# Patient Record
Sex: Female | Born: 1965 | Race: White | Hispanic: No | Marital: Single | Smoking: Never smoker
Health system: Southern US, Community
[De-identification: ages and names within clinical notes are randomized; demographics above are authoritative.]

## PROBLEM LIST (undated history)

## (undated) DIAGNOSIS — E119 Type 2 diabetes mellitus without complications: Secondary | ICD-10-CM

## (undated) DIAGNOSIS — N921 Excessive and frequent menstruation with irregular cycle: Secondary | ICD-10-CM

## (undated) DIAGNOSIS — N189 Chronic kidney disease, unspecified: Secondary | ICD-10-CM

## (undated) DIAGNOSIS — R87629 Unspecified abnormal cytological findings in specimens from vagina: Secondary | ICD-10-CM

## (undated) DIAGNOSIS — D649 Anemia, unspecified: Secondary | ICD-10-CM

## (undated) DIAGNOSIS — D219 Benign neoplasm of connective and other soft tissue, unspecified: Secondary | ICD-10-CM

## (undated) DIAGNOSIS — N939 Abnormal uterine and vaginal bleeding, unspecified: Secondary | ICD-10-CM

## (undated) DIAGNOSIS — E282 Polycystic ovarian syndrome: Secondary | ICD-10-CM

## (undated) DIAGNOSIS — E785 Hyperlipidemia, unspecified: Secondary | ICD-10-CM

## (undated) DIAGNOSIS — R51 Headache: Secondary | ICD-10-CM

## (undated) DIAGNOSIS — Z8719 Personal history of other diseases of the digestive system: Secondary | ICD-10-CM

## (undated) DIAGNOSIS — R519 Headache, unspecified: Secondary | ICD-10-CM

## (undated) DIAGNOSIS — K219 Gastro-esophageal reflux disease without esophagitis: Secondary | ICD-10-CM

## (undated) DIAGNOSIS — E669 Obesity, unspecified: Secondary | ICD-10-CM

## (undated) DIAGNOSIS — Z9889 Other specified postprocedural states: Secondary | ICD-10-CM

## (undated) DIAGNOSIS — I10 Essential (primary) hypertension: Secondary | ICD-10-CM

## (undated) DIAGNOSIS — G4730 Sleep apnea, unspecified: Secondary | ICD-10-CM

## (undated) HISTORY — DX: Hyperlipidemia, unspecified: E78.5

## (undated) HISTORY — DX: Other specified postprocedural states: Z98.890

## (undated) HISTORY — PX: COLONOSCOPY: SHX174

## (undated) HISTORY — DX: Essential (primary) hypertension: I10

## (undated) HISTORY — DX: Type 2 diabetes mellitus without complications: E11.9

## (undated) HISTORY — DX: Unspecified abnormal cytological findings in specimens from vagina: R87.629

## (undated) HISTORY — DX: Excessive and frequent menstruation with irregular cycle: N92.1

## (undated) HISTORY — DX: Abnormal uterine and vaginal bleeding, unspecified: N93.9

## (undated) HISTORY — DX: Obesity, unspecified: E66.9

## (undated) HISTORY — DX: Benign neoplasm of connective and other soft tissue, unspecified: D21.9

## (undated) HISTORY — PX: NOVASURE ABLATION: SHX5394

## (undated) HISTORY — PX: DILATION AND CURETTAGE OF UTERUS: SHX78

## (undated) HISTORY — PX: TONSILLECTOMY: SUR1361

## (undated) HISTORY — DX: Polycystic ovarian syndrome: E28.2

---

## 2000-07-18 HISTORY — PX: DILATION AND CURETTAGE OF UTERUS: SHX78

## 2006-02-03 ENCOUNTER — Ambulatory Visit: Payer: Self-pay | Admitting: Unknown Physician Specialty

## 2006-03-01 ENCOUNTER — Ambulatory Visit: Payer: Self-pay | Admitting: Urology

## 2007-07-19 DIAGNOSIS — R87629 Unspecified abnormal cytological findings in specimens from vagina: Secondary | ICD-10-CM

## 2007-07-19 HISTORY — DX: Unspecified abnormal cytological findings in specimens from vagina: R87.629

## 2008-10-24 ENCOUNTER — Ambulatory Visit: Payer: Self-pay | Admitting: Unknown Physician Specialty

## 2008-10-30 ENCOUNTER — Ambulatory Visit: Payer: Self-pay | Admitting: Unknown Physician Specialty

## 2011-06-21 ENCOUNTER — Ambulatory Visit: Payer: Self-pay | Admitting: Gastroenterology

## 2011-06-23 LAB — PATHOLOGY REPORT

## 2012-06-25 LAB — HM COLONOSCOPY: HM Colonoscopy: 3

## 2012-07-18 LAB — HM MAMMOGRAPHY

## 2012-07-18 LAB — HM PAP SMEAR

## 2012-10-08 ENCOUNTER — Ambulatory Visit: Payer: Self-pay | Admitting: Internal Medicine

## 2012-10-10 ENCOUNTER — Ambulatory Visit: Payer: Self-pay | Admitting: General Surgery

## 2012-11-14 ENCOUNTER — Encounter: Payer: Self-pay | Admitting: *Deleted

## 2013-03-11 ENCOUNTER — Ambulatory Visit: Payer: Self-pay | Admitting: Emergency Medicine

## 2013-12-27 DIAGNOSIS — I10 Essential (primary) hypertension: Secondary | ICD-10-CM | POA: Insufficient documentation

## 2014-03-26 DIAGNOSIS — Z6841 Body Mass Index (BMI) 40.0 and over, adult: Secondary | ICD-10-CM

## 2014-09-23 ENCOUNTER — Ambulatory Visit: Payer: Self-pay | Admitting: Gastroenterology

## 2014-11-25 ENCOUNTER — Other Ambulatory Visit: Payer: Self-pay

## 2014-11-25 ENCOUNTER — Encounter: Payer: Self-pay | Admitting: *Deleted

## 2014-11-25 DIAGNOSIS — R7309 Other abnormal glucose: Secondary | ICD-10-CM | POA: Diagnosis not present

## 2014-11-25 DIAGNOSIS — Z803 Family history of malignant neoplasm of breast: Secondary | ICD-10-CM | POA: Diagnosis not present

## 2014-11-25 DIAGNOSIS — D250 Submucous leiomyoma of uterus: Secondary | ICD-10-CM | POA: Diagnosis not present

## 2014-11-25 DIAGNOSIS — Z79899 Other long term (current) drug therapy: Secondary | ICD-10-CM | POA: Diagnosis not present

## 2014-11-25 DIAGNOSIS — Z9889 Other specified postprocedural states: Secondary | ICD-10-CM | POA: Diagnosis not present

## 2014-11-25 DIAGNOSIS — N939 Abnormal uterine and vaginal bleeding, unspecified: Secondary | ICD-10-CM | POA: Diagnosis not present

## 2014-11-25 DIAGNOSIS — I10 Essential (primary) hypertension: Secondary | ICD-10-CM | POA: Diagnosis not present

## 2014-11-25 DIAGNOSIS — N921 Excessive and frequent menstruation with irregular cycle: Secondary | ICD-10-CM | POA: Diagnosis present

## 2014-11-25 DIAGNOSIS — Z800 Family history of malignant neoplasm of digestive organs: Secondary | ICD-10-CM | POA: Diagnosis not present

## 2014-11-26 ENCOUNTER — Encounter
Admission: RE | Admit: 2014-11-26 | Discharge: 2014-11-26 | Disposition: A | Discharge status: Home / Self Care | Payer: 59 | Source: Ambulatory Visit | Attending: Obstetrics and Gynecology | Admitting: Obstetrics and Gynecology

## 2014-11-26 DIAGNOSIS — N921 Excessive and frequent menstruation with irregular cycle: Secondary | ICD-10-CM | POA: Diagnosis not present

## 2014-11-26 LAB — COMPREHENSIVE METABOLIC PANEL
ALBUMIN: 3.8 g/dL (ref 3.5–5.0)
ALT: 30 U/L (ref 14–54)
AST: 28 U/L (ref 15–41)
Alkaline Phosphatase: 56 U/L (ref 38–126)
Anion gap: 10 (ref 5–15)
BILIRUBIN TOTAL: 0.5 mg/dL (ref 0.3–1.2)
BUN: 18 mg/dL (ref 6–20)
CALCIUM: 9 mg/dL (ref 8.9–10.3)
CHLORIDE: 110 mmol/L (ref 101–111)
CO2: 20 mmol/L — ABNORMAL LOW (ref 22–32)
Creatinine, Ser: 0.72 mg/dL (ref 0.44–1.00)
GFR calc Af Amer: >60 mL/min (ref >60)
GFR calc non Af Amer: >60 mL/min (ref >60)
Glucose, Bld: 111 mg/dL — ABNORMAL HIGH (ref 65–99)
Potassium: 4.2 mmol/L (ref 3.5–5.1)
Sodium: 140 mmol/L (ref 135–145)
Total Protein: 7.1 g/dL (ref 6.5–8.1)

## 2014-11-26 LAB — CBC
HEMATOCRIT: 39 % (ref 35.0–47.0)
Hemoglobin: 12.9 g/dL (ref 12.0–16.0)
MCH: 28.3 pg (ref 26.0–34.0)
MCHC: 33 g/dL (ref 32.0–36.0)
MCV: 85.7 fL (ref 80.0–100.0)
PLATELETS: 254 10*3/uL (ref 150–440)
RBC: 4.55 MIL/uL (ref 3.80–5.20)
RDW: 13.9 % (ref 11.5–14.5)
WBC: 6.3 10*3/uL (ref 3.6–11.0)

## 2014-11-26 LAB — TYPE AND SCREEN
ABO/RH(D): O POS
ANTIBODY SCREEN: NEGATIVE

## 2014-11-27 LAB — ABO/RH: ABO/RH(D): O POS

## 2014-11-27 NOTE — OR Nursing (Signed)
Ekg ok per Dr Ronelle Nigh

## 2014-11-27 NOTE — OR Nursing (Signed)
Spoke with receptionist at Dr Tonette Bihari office and told her of the EKG that needed to be reviewed by him per Anesthesia's request. Faxed over to [phone].

## 2014-11-28 ENCOUNTER — Encounter
Admission: RE | Disposition: A | Discharge status: Home / Self Care | Payer: Self-pay | Source: Ambulatory Visit | Attending: Obstetrics and Gynecology

## 2014-11-28 ENCOUNTER — Ambulatory Visit
Admission: RE | Admit: 2014-11-28 | Discharge: 2014-11-28 | Disposition: A | Discharge status: Home / Self Care | Payer: 59 | Source: Ambulatory Visit | Attending: Obstetrics and Gynecology | Admitting: Obstetrics and Gynecology

## 2014-11-28 ENCOUNTER — Ambulatory Visit: Payer: 59 | Admitting: Anesthesiology

## 2014-11-28 DIAGNOSIS — N939 Abnormal uterine and vaginal bleeding, unspecified: Secondary | ICD-10-CM | POA: Insufficient documentation

## 2014-11-28 DIAGNOSIS — Z9889 Other specified postprocedural states: Secondary | ICD-10-CM | POA: Insufficient documentation

## 2014-11-28 DIAGNOSIS — Z803 Family history of malignant neoplasm of breast: Secondary | ICD-10-CM | POA: Insufficient documentation

## 2014-11-28 DIAGNOSIS — N921 Excessive and frequent menstruation with irregular cycle: Secondary | ICD-10-CM | POA: Insufficient documentation

## 2014-11-28 DIAGNOSIS — Z79899 Other long term (current) drug therapy: Secondary | ICD-10-CM | POA: Insufficient documentation

## 2014-11-28 DIAGNOSIS — D250 Submucous leiomyoma of uterus: Secondary | ICD-10-CM | POA: Insufficient documentation

## 2014-11-28 DIAGNOSIS — I10 Essential (primary) hypertension: Secondary | ICD-10-CM | POA: Insufficient documentation

## 2014-11-28 DIAGNOSIS — Z800 Family history of malignant neoplasm of digestive organs: Secondary | ICD-10-CM | POA: Insufficient documentation

## 2014-11-28 DIAGNOSIS — R7309 Other abnormal glucose: Secondary | ICD-10-CM | POA: Insufficient documentation

## 2014-11-28 HISTORY — DX: Gastro-esophageal reflux disease without esophagitis: K21.9

## 2014-11-28 HISTORY — PX: DILATATION & CURETTAGE/HYSTEROSCOPY WITH MYOSURE: SHX6511

## 2014-11-28 HISTORY — DX: Anemia, unspecified: D64.9

## 2014-11-28 HISTORY — DX: Headache: R51

## 2014-11-28 HISTORY — DX: Chronic kidney disease, unspecified: N18.9

## 2014-11-28 HISTORY — DX: Headache, unspecified: R51.9

## 2014-11-28 LAB — POCT PREGNANCY, URINE: Preg Test, Ur: NEGATIVE

## 2014-11-28 SURGERY — DILATATION & CURETTAGE/HYSTEROSCOPY WITH MYOSURE
Anesthesia: General | Wound class: Clean Contaminated

## 2014-11-28 MED ORDER — LACTATED RINGERS IR SOLN
Status: DC | PRN
  Administered 2014-11-28: 3000 mL

## 2014-11-28 MED ORDER — FENTANYL CITRATE (PF) 100 MCG/2ML IJ SOLN
INTRAMUSCULAR | Status: AC
  Administered 2014-11-28: 25 ug via INTRAVENOUS
  Filled 2014-11-28: qty 2

## 2014-11-28 MED ORDER — LIDOCAINE HCL (CARDIAC) 20 MG/ML IV SOLN
INTRAVENOUS | Status: DC | PRN
Start: 2014-11-28 — End: 2014-11-28
  Administered 2014-11-28: 100 mg via INTRAVENOUS

## 2014-11-28 MED ORDER — SODIUM CHLORIDE 0.9 % IV SOLN
INTRAVENOUS | Status: DC | PRN
  Administered 2014-11-28: 16:00:00 via INTRAVENOUS

## 2014-11-28 MED ORDER — PHENYLEPHRINE HCL 10 MG/ML IJ SOLN
INTRAMUSCULAR | Status: DC | PRN
  Administered 2014-11-28 (×4): 100 ug via INTRAVENOUS

## 2014-11-28 MED ORDER — ONDANSETRON HCL 4 MG/2ML IJ SOLN
INTRAMUSCULAR | Status: DC | PRN
  Administered 2014-11-28: 4 mg via INTRAVENOUS

## 2014-11-28 MED ORDER — ONDANSETRON HCL 4 MG/2ML IJ SOLN: 4.0000 mg | Freq: Once | INTRAMUSCULAR | Status: DC | PRN

## 2014-11-28 MED ORDER — HYDROCODONE-ACETAMINOPHEN 5-325 MG PO TABS: 1.0000 | ORAL_TABLET | Freq: Four times a day (QID) | ORAL | Status: DC | PRN

## 2014-11-28 MED ORDER — SILVER NITRATE-POT NITRATE 75-25 % EX MISC
CUTANEOUS | Status: DC | PRN
  Administered 2014-11-28 (×2): 2

## 2014-11-28 MED ORDER — HYDROCODONE-ACETAMINOPHEN 5-325 MG PO TABS: 1.0000 | ORAL_TABLET | ORAL | Status: DC | PRN

## 2014-11-28 MED ORDER — PROPOFOL 10 MG/ML IV BOLUS
INTRAVENOUS | Status: DC | PRN
  Administered 2014-11-28: 200 mg via INTRAVENOUS

## 2014-11-28 MED ORDER — IBUPROFEN 600 MG PO TABS: 600.0000 mg | ORAL_TABLET | Freq: Four times a day (QID) | ORAL | Status: DC | PRN

## 2014-11-28 MED ORDER — MIDAZOLAM HCL 2 MG/2ML IJ SOLN
INTRAMUSCULAR | Status: DC | PRN
  Administered 2014-11-28: 2 mg via INTRAVENOUS

## 2014-11-28 MED ORDER — FENTANYL CITRATE (PF) 100 MCG/2ML IJ SOLN
25.0000 ug | INTRAMUSCULAR | Status: AC | PRN
  Administered 2014-11-28 (×4): 25 ug via INTRAVENOUS

## 2014-11-28 MED ORDER — ROCURONIUM BROMIDE 100 MG/10ML IV SOLN
INTRAVENOUS | Status: DC | PRN
  Administered 2014-11-28: 5 mg via INTRAVENOUS

## 2014-11-28 MED ORDER — SILVER NITRATE-POT NITRATE 75-25 % EX MISC
CUTANEOUS | Status: AC
  Filled 2014-11-28: qty 2

## 2014-11-28 MED ORDER — SUCCINYLCHOLINE CHLORIDE 20 MG/ML IJ SOLN
INTRAMUSCULAR | Status: DC | PRN
  Administered 2014-11-28: 120 mg via INTRAVENOUS

## 2014-11-28 MED ORDER — FENTANYL CITRATE (PF) 100 MCG/2ML IJ SOLN
INTRAMUSCULAR | Status: DC | PRN
  Administered 2014-11-28 (×2): 50 ug via INTRAVENOUS

## 2014-11-28 SURGICAL SUPPLY — 20 items
ABLATOR ENDOMETRIAL MYOSURE (ABLATOR) ×3 IMPLANT
CANISTER SUC SOCK COL 7IN (MISCELLANEOUS) ×3 IMPLANT
CATH ROBINSON RED A/P 16FR (CATHETERS) ×3 IMPLANT
GLOVE BIO SURGEON STRL SZ7 (GLOVE) ×9 IMPLANT
GLOVE BIOGEL PI IND STRL 7.5 (GLOVE) ×3 IMPLANT
GLOVE BIOGEL PI INDICATOR 7.5 (GLOVE) ×6
GOWN STRL REUS W/ TWL LRG LVL3 (GOWN DISPOSABLE) ×3 IMPLANT
GOWN STRL REUS W/TWL LRG LVL3 (GOWN DISPOSABLE) ×6
IV LACTATED RINGER IRRG 3000ML (IV SOLUTION) ×6
IV LR IRRIG 3000ML ARTHROMATIC (IV SOLUTION) ×3 IMPLANT
KIT RM TURNOVER CYSTO AR (KITS) ×3 IMPLANT
MYOSURE LITE POLYP REMOVAL (MISCELLANEOUS) IMPLANT
PACK DNC HYST (MISCELLANEOUS) ×3 IMPLANT
PAD GROUND ADULT SPLIT (MISCELLANEOUS) IMPLANT
PAD OB MATERNITY 4.3X12.25 (PERSONAL CARE ITEMS) ×3 IMPLANT
PAD PREP 24X41 OB/GYN DISP (PERSONAL CARE ITEMS) ×3 IMPLANT
SUCTION CANNISTER ×6 IMPLANT
TUBING CONNECTING 10 (TUBING) ×2 IMPLANT
TUBING CONNECTING 10' (TUBING) ×1
TUBING HYSTEROSCOPY DOLPHIN (MISCELLANEOUS) ×3 IMPLANT

## 2014-11-28 NOTE — Anesthesia Procedure Notes (Signed)
Procedure Name: Intubation Date/Time: 11/28/2014 4:07 PM Performed by: Silvana Newness Pre-anesthesia Checklist: Patient identified, Emergency Drugs available, Suction available, Patient being monitored and Timeout performed Patient Re-evaluated:Patient Re-evaluated prior to inductionOxygen Delivery Method: Circle system utilized Preoxygenation: Pre-oxygenation with 100% oxygen Intubation Type: IV induction, Rapid sequence and Cricoid Pressure applied Laryngoscope Size: McGraph and 4 Grade View: Grade I Tube type: Oral Tube size: 7.0 mm Number of attempts: 1 Airway Equipment and Method: Stylet Placement Confirmation: ETT inserted through vocal cords under direct vision,  positive ETCO2 and breath sounds checked- equal and bilateral Secured at: 22 cm Dental Injury: Teeth and Oropharynx as per pre-operative assessment

## 2014-11-28 NOTE — Anesthesia Postprocedure Evaluation (Signed)
  Anesthesia Post-op Note  Patient: Rachel Gordon  Procedure(s) Performed: Procedure(s): DILATATION & CURETTAGE/HYSTEROSCOPY WITH MYOSURE (N/A)  Anesthesia type:General  Patient location: PACU  Post pain: Pain level controlled  Post assessment: Post-op Vital signs reviewed, Patient's Cardiovascular Status Stable, Respiratory Function Stable, Patent Airway and No signs of Nausea or vomiting  Post vital signs: Reviewed and stable  Last Vitals:  Filed Vitals:   11/28/14 1820  BP: 133/70  Pulse:   Temp:   Resp:     Level of consciousness: awake, alert  and patient cooperative  Complications: No apparent anesthesia complications

## 2014-11-28 NOTE — Anesthesia Preprocedure Evaluation (Addendum)
Anesthesia Evaluation    Airway Mallampati: II  TM Distance: >3 FB Neck ROM: Full    Dental  (+) Teeth Intact   Pulmonary          Cardiovascular hypertension, Pt. on medications     Neuro/Psych  Headaches (Hx of migraine x1 no other difficulties),    GI/Hepatic GERD-  Medicated and Controlled,  Endo/Other  diabetes, Well Controlled, Type 2  Renal/GU Renal disease (Stones)     Musculoskeletal   Abdominal   Peds  Hematology   Anesthesia Other Findings   Reproductive/Obstetrics                            Anesthesia Physical Anesthesia Plan  ASA: III  Anesthesia Plan: General   Post-op Pain Management:    Induction: Intravenous  Airway Management Planned: Oral ETT  Additional Equipment:   Intra-op Plan:   Post-operative Plan:   Informed Consent: I have reviewed the patients History and Physical, chart, labs and discussed the procedure including the risks, benefits and alternatives for the proposed anesthesia with the patient or authorized representative who has indicated his/her understanding and acceptance.     Plan Discussed with:   Anesthesia Plan Comments:        Anesthesia Quick Evaluation

## 2014-11-28 NOTE — Transfer of Care (Signed)
Immediate Anesthesia Transfer of Care Note  Patient: Rachel Gordon  Procedure(s) Performed: Procedure(s): DILATATION & CURETTAGE/HYSTEROSCOPY WITH MYOSURE (N/A)  Patient Location: PACU  Anesthesia Type:General  Level of Consciousness: awake, alert , oriented and patient cooperative  Airway & Oxygen Therapy: Patient Spontanous Breathing and Patient connected to face mask oxygen  Post-op Assessment: Report given to RN, Post -op Vital signs reviewed and stable and Patient moving all extremities X 4  Post vital signs: Reviewed and stable  Last Vitals:  Filed Vitals:   11/28/14 1739  BP: 146/80  Pulse: 82  Temp: 36.8 C  Resp:     Complications: No apparent anesthesia complications

## 2014-11-28 NOTE — Op Note (Signed)
Op Note Hysteroscopy, Dilation and Curettage  Pre-Op Diagnosis: 1) abnormal uterine bleeding, 2) endometrial lesion  Post-Op Diagnosis: 1) abnormal uterine bleeding, 2) endometrial lesion  Procedures:  1. Hysteroscopy 2. Dilation and curettage 3. Submucosal myomectomy  Primary Surgeon: Dr. Prentice Docker   EBL: 100 mLl   IVF: 850 mL   Urine output: 100 mL  Specimens: endometrial curetting, including myoma  Drains: None   Complications: None   Disposition: PACU   Condition: Stable   Findings:  1) broad-based myoma-like lesion on right latero/anterior uterine wall 2) distorted uterine cavity, likely due to prior endometrial ablation.  Procedure Summary:  The patient was taken to the operating room where general anesthesia was administered and found to be adequate. The patient was placed in the dorsal supine high lithotomy position in candycane stirrups. She was prepped and draped in the usual sterile fashion. Note: great care was taken to position the patient such as to minimize any nerve damage risk. After timeout was called, an in-and-out catheterization was performed for return of 100 mL's clear urine. A sterile speculum was placed in the vagina and a single-tooth tenaculum was used to grasp the anterior lip of the cervix. The cervix was dilated gently in a serial fashion using Hegar dilators to a dilatation of 6 mm. The Myosure hysteroscope was introduced gently through the cervix into the uterine cavity with the above-noted findings. The medium Myosure device was then utilized to remove the uterine fibroid. Due to risk of damaging the uterus, given her significant adhesions from her prior surgery, the fibroid was removed as much as possible. Several random other locations within the uterus were also sampled using the Myosure device.  At this point, hemostasis was verified by lowering the fluid pressure below that of the MAP. The hysteroscope was removed and the cervix was  monitored for approximately 5 minutes with minimal flow of blood coming from the cervix. The tenaculum was removed from the anterior lip of the cervix. Hemostasis was obtained using silver nitrate. The speculum was removed from the vagina after verifying that no instrumentation or sponges were left in the vagina.  Sponge, lap, needle, and instrument counts were correct x 2.  VTE prophylaxis SCDs. Antibiotic prophylaxis none.The patient was then awakened and taken to the recovery room in stable condition.   Will Bonnet, MD, [place] 11/28/2014 5:44 PM

## 2014-11-28 NOTE — H&P (Signed)
History and Physical Interval Note:  Rachel Gordon  has presented today for surgery, with the diagnosis of MENOMETRRHAGIA  The various methods of treatment have been discussed with the patient and family. After consideration of risks, benefits and other options for treatment, the patient has consented to  Procedure(s): [place] (N/A) as a surgical intervention .  The patient's history has been reviewed, patient examined, no change in status, stable for surgery.  I have reviewed the patient's chart and labs.  Questions were answered to the patient's satisfaction.    She does not take a beta blocker  Will Bonnet, MD, [facility] 11/28/2014 3:48 PM

## 2014-11-30 ENCOUNTER — Encounter: Payer: Self-pay | Admitting: Obstetrics and Gynecology

## 2014-12-01 LAB — GLUCOSE, CAPILLARY: Glucose-Capillary: 98 mg/dL (ref 65–99)

## 2014-12-02 LAB — SURGICAL PATHOLOGY

## 2015-01-07 ENCOUNTER — Ambulatory Visit: Payer: Self-pay | Admitting: Obstetrics and Gynecology

## 2016-06-16 LAB — HM PAP SMEAR: HM PAP: NORMAL

## 2017-03-14 ENCOUNTER — Encounter: Payer: Self-pay | Admitting: Maternal Newborn

## 2017-03-14 ENCOUNTER — Ambulatory Visit (INDEPENDENT_AMBULATORY_CARE_PROVIDER_SITE_OTHER): Payer: 59 | Admitting: Maternal Newborn

## 2017-03-14 VITALS — BP 160/98 | HR 106 | Ht 64.0 in | Wt 341.0 lb

## 2017-03-14 DIAGNOSIS — E1122 Type 2 diabetes mellitus with diabetic chronic kidney disease: Secondary | ICD-10-CM | POA: Insufficient documentation

## 2017-03-14 DIAGNOSIS — N182 Chronic kidney disease, stage 2 (mild): Secondary | ICD-10-CM

## 2017-03-14 DIAGNOSIS — L298 Other pruritus: Secondary | ICD-10-CM | POA: Diagnosis not present

## 2017-03-14 DIAGNOSIS — E7800 Pure hypercholesterolemia, unspecified: Secondary | ICD-10-CM | POA: Insufficient documentation

## 2017-03-14 DIAGNOSIS — N898 Other specified noninflammatory disorders of vagina: Secondary | ICD-10-CM

## 2017-03-14 LAB — POCT WET PREP (WET MOUNT): Trichomonas Wet Prep HPF POC: ABSENT

## 2017-03-14 MED ORDER — HYLAFEM VA SUPP: 1.0000 | VAGINAL | 1 refills | Status: AC

## 2017-03-14 NOTE — Progress Notes (Signed)
Obstetrics & Gynecology Office Visit   Chief Complaint:  Chief Complaint  Patient presents with  . vaginal irritation    itching/odor x 1 week    History of Present Illness: Ms. Rachel Gordon is a 51 y.o. No obstetric history on file. who LMP was No LMP recorded. Patient has had an ablation., presents today for a problem visit.   Patient complains of an abnormal vaginal discharge for 7 days. Discharge described as: normal and physiologic. Vaginal symptoms include odor. Vulvar symptoms include local irritation, odor and vulvar itching.STI Risk: Very low risk of STD exposure.   Other associated symptoms: none. Menstrual pattern: S/p ablation. She admits to recent antibiotic exposure, denies changes in soaps, detergents coinciding with the onset of her symptoms.  She has previously self treated or been under treatment by another provider for these symptoms; took fluconazole when she was treated for a UTI/itching on 02/23/17.    Review of Systems: Negative except as noted in HPI.  Past Medical History:  Past Medical History:  Diagnosis Date  . Abnormal uterine bleeding (AUB)   . Anemia   . Chronic kidney disease   . DM (diabetes mellitus) ([place])   . Fibroid   . GERD (gastroesophageal reflux disease)   . Headache    h/o  . Hyperlipidemia   . Hypertension   . Metrorrhagia   . Obesity    bmi 54  . PCOS (polycystic ovarian syndrome)   . S/P endometrial ablation   . Vaginal Pap smear, abnormal 2009   pos hvp    Past Surgical History:  Past Surgical History:  Procedure Laterality Date  . COLONOSCOPY    . DILATATION & CURETTAGE/HYSTEROSCOPY WITH MYOSURE N/A 11/28/2014   Procedure: DILATATION & CURETTAGE/HYSTEROSCOPY WITH MYOSURE;  Surgeon: Will Bonnet, MD;  Location: [facility] ORS;  Service: Gynecology;  Laterality: N/A;  . DILATION AND CURETTAGE OF UTERUS    . [place]      Gynecologic History: No LMP recorded. Patient has had an ablation.  Obstetric  History: No obstetric history on file.  Family History:  No family history on file.  Social History:  Social History   Social History  . Marital status: Single    Spouse name: N/A  . Number of children: N/A  . Years of education: N/A   Occupational History  . Not on file.   Social History Main Topics  . Smoking status: Never Smoker  . Smokeless tobacco: Never Used  . Alcohol use Yes     Comment: socially  . Drug use: No  . Sexual activity: Yes    Partners: Male    Birth control/ protection: None   Other Topics Concern  . Not on file   Social History Narrative  . No narrative on file    Allergies:  No Known Allergies  Medications: Prior to Admission medications   Medication Sig Start Date End Date Taking? Authorizing Provider  lisinopril (PRINIVIL,ZESTRIL) 40 MG tablet Take 40 mg by mouth every morning.    Yes [provider]  metFORMIN (GLUMETZA) 500 MG (MOD) 24 hr tablet Take 500 mg by mouth every evening.    Yes [provider]  norethindrone (AYGESTIN) 5 MG tablet  12/20/16  Yes [provider]  omeprazole (PRILOSEC) 40 MG capsule Take 40 mg by mouth every morning.    Yes [provider]  simvastatin (ZOCOR) 40 MG tablet Take 40 mg by mouth every evening.    Yes [provider]  Homeopathic Products (HYLAFEM) SUPP Place 1 suppository vaginally 1 day or 1 dose. 03/14/17 03/17/17  Rexene Agent, CNM    Physical Exam Vitals:  Vitals:   03/14/17 0807  BP: (!) 160/98  Pulse: (!) 106   No LMP recorded. Patient has had an ablation.  General: NAD HEENT: normocephalic, anicteric Genitourinary:  External: erythema on labia majora and vulva. No discharge seen.  Normal urethral meatus, normal Bartholin's and Skene's glands.    Vagina: Normal vaginal mucosa, no evidence of prolapse. No discharge seen.  Cervix: Grossly normal in appearance, no bleeding  Uterus: Not evaluated  Adnexa: Not evaluated  Rectal:  deferred  Lymphatic: no evidence of inguinal lymphadenopathy Neurologic: Grossly intact Psychiatric: mood appropriate, affect full  Wet Prep: PH: 4 Clue Cells: Negative Fungal elements: Negative Trichomonas: Negative  Assessment: 51 y.o. No obstetric history on file presents with vaginal itching. Treated for UTI last week and also took fluconazole as prescribed at that time.  Plan: Problem List Items Addressed This Visit     Visit Diagnoses    Vagina itching    -  Primary   Relevant Medications   Homeopathic Products (HYLAFEM) SUPP   Other Relevant Orders   NuSwab Vaginitis Plus (VG+)   POCT Wet Prep [facility]) (Completed)     1) Because wet prep was negative for yeast, did not prescribe antifungal medication at this time. Sent NuSwab to see if there may be yeast species present that would be resistant to fluconazole. Will notify patient of results.  2) Discussed the use of Hylafem to restore normal vaginal flora after antibiotic use and sent Rx.  3) Although patient denies exposure to new soaps or detergents, discussed dermatitis as cause of sx; she may want to try different products.  4) F/u for worsening sx or as needed.  Avel Sensor, CNM 03/14/2017  9:37 AM

## 2017-03-17 LAB — NUSWAB VAGINITIS PLUS (VG+)
Candida albicans, NAA: NEGATIVE
Candida glabrata, NAA: NEGATIVE
Chlamydia trachomatis, NAA: NEGATIVE
Neisseria gonorrhoeae, NAA: NEGATIVE
TRICH VAG BY NAA: NEGATIVE

## 2017-03-21 ENCOUNTER — Telehealth: Payer: Self-pay | Admitting: Maternal Newborn

## 2017-03-21 NOTE — Telephone Encounter (Signed)
Please call patient with lab results

## 2017-03-22 ENCOUNTER — Other Ambulatory Visit: Payer: Self-pay | Admitting: Obstetrics and Gynecology

## 2017-03-22 NOTE — Telephone Encounter (Signed)
Pt goes on vac in 6d, rx for norethindrone 5mg  won't be in until 8d.  Needs one month supply sent to [facility].  Pt aware by vm refill eRx'd.

## 2017-03-23 ENCOUNTER — Other Ambulatory Visit: Payer: Self-pay

## 2017-06-21 ENCOUNTER — Encounter: Payer: Self-pay | Admitting: Obstetrics and Gynecology

## 2017-06-21 ENCOUNTER — Ambulatory Visit (INDEPENDENT_AMBULATORY_CARE_PROVIDER_SITE_OTHER): Payer: 59 | Admitting: Obstetrics and Gynecology

## 2017-06-21 VITALS — BP 128/88 | Ht 64.0 in | Wt 320.0 lb

## 2017-06-21 DIAGNOSIS — Z1339 Encounter for screening examination for other mental health and behavioral disorders: Secondary | ICD-10-CM | POA: Diagnosis not present

## 2017-06-21 DIAGNOSIS — Z1331 Encounter for screening for depression: Secondary | ICD-10-CM | POA: Diagnosis not present

## 2017-06-21 DIAGNOSIS — Z01419 Encounter for gynecological examination (general) (routine) without abnormal findings: Secondary | ICD-10-CM

## 2017-06-21 NOTE — Progress Notes (Signed)
Gynecology Annual Exam   PCP: Kirk Ruths, MD  Chief Complaint  Patient presents with  . Annual Exam   History of Present Illness:  Ms. Rachel Gordon is an established patient, 51 y.o. No obstetric history on file. who LMP was No LMP recorded. Patient has had an ablation., presents today for her annual examination.  Her menses are absent due to endometrial ablation.  Denies additional bleeding.   She does not have vasomotor sx.   She is single partner  Last Pap: 1 year  Results were: no abnormalities /neg HPV DNA.  Hx of STDs: none  Last mammogram: 1 year ago  Results were: normal--routine follow-up in 12 months There is no FH of breast cancer. There is no FH of ovarian cancer. The patient does not do self-breast exams.  Colonoscopy: not done DEXA: has not been screened for osteoporosis  Tobacco use: The patient denies current or previous tobacco use. Alcohol use: social drinker Exercise: not active  The patient wears seatbelts: yes.     Past Medical History:  Diagnosis Date  . Abnormal uterine bleeding (AUB)   . Anemia   . Chronic kidney disease   . DM (diabetes mellitus) ([place])   . Fibroid   . GERD (gastroesophageal reflux disease)   . Headache    h/o  . Hyperlipidemia   . Hypertension   . Metrorrhagia   . Obesity    bmi 54  . PCOS (polycystic ovarian syndrome)   . S/P endometrial ablation   . Vaginal Pap smear, abnormal 2009   pos hvp    Past Surgical History:  Procedure Laterality Date  . COLONOSCOPY    . DILATATION & CURETTAGE/HYSTEROSCOPY WITH MYOSURE N/A 11/28/2014   Procedure: DILATATION & CURETTAGE/HYSTEROSCOPY WITH MYOSURE;  Surgeon: Will Bonnet, MD;  Location: [facility] ORS;  Service: Gynecology;  Laterality: N/A;  . DILATION AND CURETTAGE OF UTERUS    . NOVASURE ABLATION      Prior to Admission medications   Medication Sig Start Date End Date Taking? Authorizing Provider  lisinopril (PRINIVIL,ZESTRIL) 40 MG tablet Take 40  mg by mouth every morning.    Yes [provider]  metFORMIN (GLUMETZA) 500 MG (MOD) 24 hr tablet Take 500 mg by mouth every evening.    Yes [provider]  norethindrone (AYGESTIN) 5 MG tablet Take 1 tablet (5 mg total) by mouth daily. 03/22/17  Yes Will Bonnet, MD  omeprazole (PRILOSEC) 40 MG capsule Take 40 mg by mouth every morning.    Yes [provider]  simvastatin (ZOCOR) 40 MG tablet Take 40 mg by mouth every evening.    Yes [provider]   Allergies: No Known Allergies   Obstetric History: No obstetric history on file.  History reviewed. No pertinent family history.  Social History   Socioeconomic History  . Marital status: Single    Spouse name: Not on file  . Number of children: Not on file  . Years of education: Not on file  . Highest education level: Not on file  Social Needs  . Financial resource strain: Not on file  . Food insecurity - worry: Not on file  . Food insecurity - inability: Not on file  . Transportation needs - medical: Not on file  . Transportation needs - non-medical: Not on file  Occupational History  . Not on file  Tobacco Use  . Smoking status: Never Smoker  . Smokeless tobacco: Never Used  Substance and Sexual  Activity  . Alcohol use: Yes    Comment: socially  . Drug use: No  . Sexual activity: Yes    Partners: Male    Birth control/protection: None  Other Topics Concern  . Not on file  Social History Narrative  . Not on file    Review of Systems  Constitutional: Negative.   HENT: Negative.   Eyes: Negative.   Respiratory: Negative.   Cardiovascular: Negative.   Gastrointestinal: Negative.   Genitourinary: Negative.   Musculoskeletal: Negative.   Skin: Negative.   Neurological: Negative.   Psychiatric/Behavioral: Negative.      Physical Exam BP 128/88   Ht 5\' 4"  (1.626 m)   Wt (!) 320 lb (145.2 kg)   BMI 54.93 kg/m   Physical Exam  Constitutional: She is oriented to person,  place, and time. She appears well-developed and well-nourished. No distress.  Genitourinary: Uterus normal. Pelvic exam was performed with patient supine. There is no rash, tenderness, lesion or injury on the right labia. There is no rash, tenderness, lesion or injury on the left labia. No erythema, tenderness or bleeding in the vagina. No signs of injury around the vagina. No vaginal discharge found. Right adnexum does not display mass, does not display tenderness and does not display fullness. Left adnexum does not display mass, does not display tenderness and does not display fullness. Cervix does not exhibit motion tenderness, lesion, discharge or polyp.   Uterus is mobile and anteverted. Uterus is not enlarged, tender or exhibiting a mass.  Genitourinary Comments: Bimanual exam very limited by patient's body habitus  HENT:  Head: Normocephalic and atraumatic.  Eyes: EOM are normal. No scleral icterus.  Neck: Normal range of motion. Neck supple. No thyromegaly present.  Cardiovascular: Normal rate and regular rhythm. Exam reveals no gallop and no friction rub.  No murmur heard. Pulmonary/Chest: Effort normal and breath sounds normal. No respiratory distress. She has no wheezes. She has no rales. Right breast exhibits no inverted nipple, no mass, no nipple discharge, no skin change and no tenderness. Left breast exhibits no inverted nipple, no mass, no nipple discharge, no skin change and no tenderness.  Abdominal: Soft. Bowel sounds are normal. She exhibits no distension and no mass. There is no tenderness. There is no rebound and no guarding.  Musculoskeletal: Normal range of motion. She exhibits no edema or tenderness.  Lymphadenopathy:    She has no cervical adenopathy.       Right: No inguinal adenopathy present.       Left: No inguinal adenopathy present.  Neurological: She is alert and oriented to person, place, and time. No cranial nerve deficit.  Skin: Skin is warm and dry. No rash  noted. No erythema.  Psychiatric: She has a normal mood and affect. Her behavior is normal. Judgment normal.   Female chaperone present for pelvic and breast  portions of the physical exam  Results: AUDIT Questionnaire (screen for alcoholism): 2 PHQ-9: 2  Assessment: 51 y.o. No obstetric history on file. female here for routine gynecologic examination.  Plan: Problem List Items Addressed This Visit    None    Visit Diagnoses    Women's annual routine gynecological examination    -  Primary   Screening for depression       Screening for alcohol problem          Screening: -- Blood pressure screen managed by PCP -- Colonoscopy - not due -- Mammogram - due. Patient to call [place] to arrange. She  understands that it is her responsibility to arrange this. -- Weight screening: obese: discussed management options, including lifestyle, dietary, and exercise. -- Depression screening negative (PHQ-9) -- Nutrition: normal -- cholesterol screening: per PCP -- osteoporosis screening: not due -- tobacco screening: not using -- alcohol screening: AUDIT questionnaire indicates low-risk usage. -- family history of breast cancer screening: done. not at high risk. -- no evidence of domestic violence or intimate partner violence. -- STD screening: gonorrhea/chlamydia NAAT not collected per patient request. -- pap smear not collected per ASCCP guidelines -- flu vaccine received already -- HPV vaccination series: not eligilbe  Prentice Docker, MD 06/21/2017 7:03 PM

## 2017-09-05 ENCOUNTER — Ambulatory Visit: Payer: 59 | Admitting: Obstetrics and Gynecology

## 2017-09-05 VITALS — BP 142/98 | Wt 323.0 lb

## 2017-09-05 DIAGNOSIS — N939 Abnormal uterine and vaginal bleeding, unspecified: Secondary | ICD-10-CM

## 2017-09-05 NOTE — Progress Notes (Signed)
Obstetrics & Gynecology Office Visit   Chief Complaint  Patient presents with  . Menstrual Problem   History of Present Illness: 52 y.o. female who has a history of a history of abnormal uterine bleeding and even underwent an endometrial ablation multiple years ago.  She did undergo a submucosal myomectomy by me in 2016 due to abnormal bleeding. She has had no vaginal bleeding since that time.  However, recently she had an episode of bright red vaginal bleeding that was heavy at first and tapered off.  She has no bleeding now. She denies cramping.  She denies weight loss, early satiety, constipation, bloating.     Past Medical History:  Diagnosis Date  . Abnormal uterine bleeding (AUB)   . Anemia   . Chronic kidney disease   . DM (diabetes mellitus) ([place])   . Fibroid   . GERD (gastroesophageal reflux disease)   . Headache    h/o  . Hyperlipidemia   . Hypertension   . Metrorrhagia   . Obesity    bmi 54  . PCOS (polycystic ovarian syndrome)   . S/P endometrial ablation   . Vaginal Pap smear, abnormal 2009   pos hvp    Past Surgical History:  Procedure Laterality Date  . COLONOSCOPY    . DILATATION & CURETTAGE/HYSTEROSCOPY WITH MYOSURE N/A 11/28/2014   Procedure: DILATATION & CURETTAGE/HYSTEROSCOPY WITH MYOSURE;  Surgeon: Will Bonnet, MD;  Location: [facility] ORS;  Service: Gynecology;  Laterality: N/A;  . DILATION AND CURETTAGE OF UTERUS    . [place]      Gynecologic History: No LMP recorded. Patient has had an ablation.  Family history: denies history of gynecologic cancer   Social History   Socioeconomic History  . Marital status: Single    Spouse name: Not on file  . Number of children: Not on file  . Years of education: Not on file  . Highest education level: Not on file  Social Needs  . Financial resource strain: Not on file  . Food insecurity - worry: Not on file  . Food insecurity - inability: Not on file  . Transportation needs - medical: Not  on file  . Transportation needs - non-medical: Not on file  Occupational History  . Not on file  Tobacco Use  . Smoking status: Never Smoker  . Smokeless tobacco: Never Used  Substance and Sexual Activity  . Alcohol use: Yes    Comment: socially  . Drug use: No  . Sexual activity: Yes    Partners: Male    Birth control/protection: None  Other Topics Concern  . Not on file  Social History Narrative  . Not on file   Allergies: No Known Allergies  Prior to Admission medications   Medication Sig Start Date End Date Taking? Authorizing Provider  lisinopril (PRINIVIL,ZESTRIL) 40 MG tablet Take 40 mg by mouth every morning.     [provider]  metFORMIN (GLUMETZA) 500 MG (MOD) 24 hr tablet Take 500 mg by mouth every evening.     [provider]  norethindrone (AYGESTIN) 5 MG tablet Take 1 tablet (5 mg total) by mouth daily. 03/22/17   Will Bonnet, MD  omeprazole (PRILOSEC) 40 MG capsule Take 40 mg by mouth every morning.     [provider]  simvastatin (ZOCOR) 40 MG tablet Take 40 mg by mouth every evening.     [provider]    Review of Systems  Constitutional: Negative.   HENT: Negative.  Respiratory: Negative.   Cardiovascular: Negative.   Gastrointestinal: Negative.   Genitourinary: Negative.        Unless noted in HPI  Musculoskeletal: Negative.   Skin: Negative.   Neurological: Negative.   Psychiatric/Behavioral: Negative.      Physical Exam BP (!) 142/98   Wt (!) 323 lb (146.5 kg)   BMI 55.44 kg/m  No LMP recorded. Patient has had an ablation. Physical Exam  Constitutional: She is oriented to person, place, and time. She appears well-developed and well-nourished. No distress.  HENT:  Head: Normocephalic and atraumatic.  Eyes: Conjunctivae are normal. No scleral icterus.  Neurological: She is alert and oriented to person, place, and time. No cranial nerve deficit.  Psychiatric: She has a normal mood and affect. Her  behavior is normal. Judgment normal.    Assessment: 52 y.o. No obstetric history on file. female here for  1. Abnormal uterine and vaginal bleeding, unspecified      Plan: Problem List Items Addressed This Visit    None    Visit Diagnoses    Abnormal uterine and vaginal bleeding, unspecified    -  Primary    Discussed bleeding with patient.  She has had no bleeding since her myomectomy.  Her uterine cavity was somewhat distorted at the time of her hysteroscopy in 2016.  However, her myoma was resected successfully.  Discussed that this could be a one-off event.  However, this could be the beginning of more vaginal bleeding.  Discussed performing a complete workup at this time. Discussed waiting to see if her bleeding persists.  If her bleeding persists, will perform complete workup, including ultrasound, endometrial biopsy, etc.  She is in agreement with waiting to see how her bleeding pattern is.  If it is very infrequent, will continue to monitor. However, if it is more frequent, will perform the workup.    15 minutes spent in face to face discussion with > 50% spent in counseling,management, and coordination of care of her abnormal uterine bleeding.   Prentice Docker, MD 09/05/2017 12:05 PM

## 2017-09-09 ENCOUNTER — Encounter: Payer: Self-pay | Admitting: Obstetrics and Gynecology

## 2017-09-25 ENCOUNTER — Other Ambulatory Visit: Payer: Self-pay | Admitting: Obstetrics and Gynecology

## 2018-06-28 ENCOUNTER — Ambulatory Visit (INDEPENDENT_AMBULATORY_CARE_PROVIDER_SITE_OTHER): Payer: 59 | Admitting: Obstetrics and Gynecology

## 2018-06-28 ENCOUNTER — Other Ambulatory Visit (HOSPITAL_COMMUNITY)
Admission: RE | Admit: 2018-06-28 | Discharge: 2018-06-28 | Disposition: A | Discharge status: Home / Self Care | Payer: 59 | Source: Ambulatory Visit | Attending: Obstetrics and Gynecology | Admitting: Obstetrics and Gynecology

## 2018-06-28 ENCOUNTER — Encounter: Payer: Self-pay | Admitting: Obstetrics and Gynecology

## 2018-06-28 VITALS — BP 149/88 | HR 103 | Ht 64.0 in | Wt 322.0 lb

## 2018-06-28 DIAGNOSIS — Z124 Encounter for screening for malignant neoplasm of cervix: Secondary | ICD-10-CM

## 2018-06-28 DIAGNOSIS — Z113 Encounter for screening for infections with a predominantly sexual mode of transmission: Secondary | ICD-10-CM | POA: Insufficient documentation

## 2018-06-28 DIAGNOSIS — Z1339 Encounter for screening examination for other mental health and behavioral disorders: Secondary | ICD-10-CM

## 2018-06-28 DIAGNOSIS — Z1331 Encounter for screening for depression: Secondary | ICD-10-CM

## 2018-06-28 DIAGNOSIS — Z01419 Encounter for gynecological examination (general) (routine) without abnormal findings: Secondary | ICD-10-CM | POA: Diagnosis present

## 2018-06-28 NOTE — Progress Notes (Addendum)
Gynecology Annual Exam  PCP: Kirk Ruths, MD  Chief Complaint  Patient presents with  . Gynecologic Exam   History of Present Illness:  Ms. Rachel Gordon is a 52 y.o. G55 female who LMP was No LMP recorded. Patient has had an ablation., presents today for her annual examination.  Her menses are generally absent due to an ablation.   She does have vasomotor sx.  She has hot flashes a couple of times per week.  They last about 10 minutes.  She sleeps well.     She is single partner, contraception - ablation (no other birth control). She does not have vaginal dryness.  Last Pap: 2017  Results were: no abnormalities /neg HPV DNA.  Hx of STDs: none  Last mammogram: 1 year  Results were: normal--routine follow-up in 12 months There is no FH of breast cancer. There is no FH of ovarian cancer. The patient does do self-breast exams.  Colonoscopy: 2-3  Years. Due in 2 years. DEXA: has not been screened for osteoporosis  Tobacco use: The patient denies current or previous tobacco use. Alcohol use: a couple of times per week Exercise: not active  The patient wears seatbelts: yes.     Past Medical History:  Diagnosis Date  . Abnormal uterine bleeding (AUB)   . Anemia   . Chronic kidney disease   . DM (diabetes mellitus) ([place])   . Fibroid   . GERD (gastroesophageal reflux disease)   . Headache    h/o  . Hyperlipidemia   . Hypertension   . Metrorrhagia   . Obesity    bmi 54  . PCOS (polycystic ovarian syndrome)   . S/P endometrial ablation   . Vaginal Pap smear, abnormal 2009   pos hvp    Past Surgical History:  Procedure Laterality Date  . COLONOSCOPY    . DILATATION & CURETTAGE/HYSTEROSCOPY WITH MYOSURE N/A 11/28/2014   Procedure: DILATATION & CURETTAGE/HYSTEROSCOPY WITH MYOSURE;  Surgeon: Will Bonnet, MD;  Location: [facility] ORS;  Service: Gynecology;  Laterality: N/A;  . DILATION AND CURETTAGE OF UTERUS    . NOVASURE ABLATION      Prior to Admission  medications   Medication Sig Start Date End Date Taking? Authorizing Provider  lisinopril (PRINIVIL,ZESTRIL) 40 MG tablet Take 40 mg by mouth every morning.    Yes [provider]  metFORMIN (GLUMETZA) 500 MG (MOD) 24 hr tablet Take 500 mg by mouth every evening.    Yes [provider]  norethindrone (AYGESTIN) 5 MG tablet TAKE ONE TABLET DAILY 09/25/17  Yes Will Bonnet, MD  omeprazole (PRILOSEC) 40 MG capsule Take 40 mg by mouth every morning.    Yes [provider]  simvastatin (ZOCOR) 40 MG tablet Take 40 mg by mouth every evening.    Yes [provider]   Allergies: No Known Allergies  Obstetric History: G0  Family History  Problem Relation Age of Onset  . Colon cancer Mother 70  . Colon cancer Father 42  . Breast cancer Maternal Grandmother 70   Social History   Socioeconomic History  . Marital status: Single    Spouse name: Not on file  . Number of children: Not on file  . Years of education: Not on file  . Highest education level: Not on file  Occupational History  . Not on file  Social Needs  . Financial resource strain: Not on file  . Food insecurity:    Worry: Not on file  Inability: Not on file  . Transportation needs:    Medical: Not on file    Non-medical: Not on file  Tobacco Use  . Smoking status: Never Smoker  . Smokeless tobacco: Never Used  Substance and Sexual Activity  . Alcohol use: Yes    Comment: socially  . Drug use: No  . Sexual activity: Yes    Partners: Male    Birth control/protection: None  Lifestyle  . Physical activity:    Days per week: Not on file    Minutes per session: Not on file  . Stress: Not on file  Relationships  . Social connections:    Talks on phone: Not on file    Gets together: Not on file    Attends religious service: Not on file    Active member of club or organization: Not on file    Attends meetings of clubs or organizations: Not on file    Relationship status: Not  on file  . Intimate partner violence:    Fear of current or ex partner: Not on file    Emotionally abused: Not on file    Physically abused: Not on file    Forced sexual activity: Not on file  Other Topics Concern  . Not on file  Social History Narrative  . Not on file    Review of Systems  Constitutional: Negative.   HENT: Negative.   Eyes: Negative.   Respiratory: Negative.   Cardiovascular: Negative.   Gastrointestinal: Negative.   Genitourinary: Negative.   Musculoskeletal: Negative.   Skin: Negative.   Neurological: Negative.   Psychiatric/Behavioral: Negative.      Physical Exam BP (!) 149/88   Pulse (!) 103   Ht 5\' 4"  (1.626 m)   Wt (!) 322 lb (146.1 kg)   BMI 55.27 kg/m   Physical Exam Constitutional:      General: She is not in acute distress.    Appearance: She is well-developed. She is obese. She is not ill-appearing.  Genitourinary:     Pelvic exam was performed with patient supine.     Vulva, inguinal canal, urethra and uterus normal.     No posterior fourchette injury or lesion present.     No inguinal adenopathy present in the right or left side.    No signs of injury in the vagina.     No vaginal discharge, erythema, tenderness or bleeding.     No cervical motion tenderness, discharge, lesion, bleeding or polyp.     Uterus is mobile.     Uterus is not enlarged or tender.     No uterine mass detected.    Uterus is anteverted.     No right or left adnexal mass present.     Right adnexa not tender or full.     Left adnexa not tender or full.     Genitourinary Comments: Exam limited by patient's body habitus  HENT:     Head: Normocephalic and atraumatic.  Eyes:     General: No scleral icterus.    Conjunctiva/sclera: Conjunctivae normal.  Neck:     Musculoskeletal: Normal range of motion and neck supple.     Thyroid: No thyromegaly.  Cardiovascular:     Rate and Rhythm: Normal rate and regular rhythm.     Heart sounds: No murmur. No friction  rub. No gallop.   Pulmonary:     Effort: Pulmonary effort is normal. No respiratory distress.     Breath sounds: Normal breath sounds. No  wheezing or rales.  Chest:     Breasts:        Right: No inverted nipple, mass, nipple discharge, skin change or tenderness.        Left: No inverted nipple, mass, nipple discharge, skin change or tenderness.  Abdominal:     General: Bowel sounds are normal. There is no distension.     Palpations: Abdomen is soft. There is no mass.     Tenderness: There is no abdominal tenderness. There is no guarding or rebound.  Musculoskeletal: Normal range of motion.        General: No swelling or tenderness.  Lymphadenopathy:     Cervical: No cervical adenopathy.     Lower Body: No right inguinal adenopathy. No left inguinal adenopathy.  Neurological:     Mental Status: She is alert and oriented to person, place, and time.     Cranial Nerves: No cranial nerve deficit.  Skin:    General: Skin is warm and dry.     Findings: No erythema or rash.  Psychiatric:        Mood and Affect: Mood normal.        Behavior: Behavior normal.        Judgment: Judgment normal.   Female chaperone present for pelvic and breast  portions of the physical exam   Assessment: 52 y.o. No obstetric history on file. female here for routine gynecologic examination.  Plan: Problem List Items Addressed This Visit    None    Visit Diagnoses    Women's annual routine gynecological examination    -  Primary   Relevant Orders   Cytology - PAP   Screening for depression       Screening for alcoholism       Pap smear for cervical cancer screening       Relevant Orders   Cytology - PAP   Screen for STD (sexually transmitted disease)       Relevant Orders   Cytology - PAP      Screening: -- Blood pressure screen managed by PCP -- Colonoscopy - not due -- Mammogram - due. Patient to call [place] to arrange. She understands that it is her responsibility to arrange this. --  Weight screening: obese: discussed management options, including lifestyle, dietary, and exercise. -- Depression screening negative (PHQ-9) -- Nutrition: normal -- cholesterol screening: per PCP -- osteoporosis screening: not due -- tobacco screening: not using -- alcohol screening: AUDIT questionnaire indicates low-risk usage. -- family history of breast cancer screening: done. not at high risk. -- no evidence of domestic violence or intimate partner violence. -- STD screening: gonorrhea/chlamydia NAAT collected -- pap smear collected per ASCCP guidelines -- flu vaccine: received this year -- HPV vaccination series: not eligilbe  Prentice Docker, MD 06/28/2018 5:57 PM

## 2018-07-04 LAB — CYTOLOGY - PAP
Adequacy: ABSENT
CHLAMYDIA, DNA PROBE: NEGATIVE
Diagnosis: NEGATIVE
HPV: NOT DETECTED
Neisseria Gonorrhea: NEGATIVE

## 2018-10-04 ENCOUNTER — Other Ambulatory Visit: Payer: Self-pay | Admitting: Adult Health

## 2018-10-04 ENCOUNTER — Encounter: Payer: Self-pay | Admitting: Adult Health

## 2018-10-04 ENCOUNTER — Ambulatory Visit: Payer: 59 | Admitting: Adult Health

## 2018-10-04 VITALS — BP 130/80 | HR 75 | Resp 16 | Ht 64.0 in | Wt 317.0 lb

## 2018-10-04 DIAGNOSIS — I10 Essential (primary) hypertension: Secondary | ICD-10-CM | POA: Diagnosis not present

## 2018-10-04 DIAGNOSIS — E1165 Type 2 diabetes mellitus with hyperglycemia: Secondary | ICD-10-CM

## 2018-10-04 DIAGNOSIS — E785 Hyperlipidemia, unspecified: Secondary | ICD-10-CM | POA: Diagnosis not present

## 2018-10-04 DIAGNOSIS — Z9989 Dependence on other enabling machines and devices: Secondary | ICD-10-CM

## 2018-10-04 DIAGNOSIS — G4733 Obstructive sleep apnea (adult) (pediatric): Secondary | ICD-10-CM

## 2018-10-04 DIAGNOSIS — E282 Polycystic ovarian syndrome: Secondary | ICD-10-CM

## 2018-10-04 DIAGNOSIS — R7989 Other specified abnormal findings of blood chemistry: Secondary | ICD-10-CM

## 2018-10-04 DIAGNOSIS — Z6841 Body Mass Index (BMI) 40.0 and over, adult: Secondary | ICD-10-CM

## 2018-10-04 LAB — POCT GLYCOSYLATED HEMOGLOBIN (HGB A1C): Hemoglobin A1C: 9.7 % — AB (ref 4.0–5.6)

## 2018-10-04 MED ORDER — GLUCOSE BLOOD VI STRP: ORAL_STRIP | 12 refills | Status: DC

## 2018-10-04 MED ORDER — ONETOUCH ULTRA 2 W/DEVICE KIT: 1.0000 | PACK | Freq: Two times a day (BID) | 0 refills | Status: DC

## 2018-10-04 MED ORDER — SEMAGLUTIDE 7 MG PO TABS: 7.0000 mg | ORAL_TABLET | Freq: Every day | ORAL | 1 refills | Status: DC

## 2018-10-04 MED ORDER — ONETOUCH ULTRASOFT LANCETS MISC: 12 refills | Status: DC

## 2018-10-04 NOTE — Progress Notes (Signed)
[facility] [place], [place] [address]  Internal MEDICINE  Office Visit Note  Patient Name: Rachel Gordon  [id number]  [id number]  Date of Service: 10/16/2018   Complaints/HPI Pt is here for establishment of PCP. Chief Complaint  Patient presents with  . New Patient (Initial Visit)  . Diabetes   HPI Pt is here to establish care. She is a well appearing 53 you caucasian female with a history of HLD, HTN and DM.  She currently works as a Dance movement psychotherapist for [employer].  She reports that her hypertension is well controlled and she denies any headaches, chest pain, shortness of breath or palpitations.  She states that she takes medicine for her high cholesterol however she is not sure if it is active or not.  Her diabetes is not well controlled.  Based on our discussion I feel like this is a combination of her diet as well as the medication regimen she is currently taking.  She is willing to change her medications and we discussed diet at this visit also.  We also discussed the possibility of gastric bypass and she reports that she is curious about this however she has financial concerns based on her insurance from a previous physician.    Current Medication: Outpatient Encounter Medications as of 10/04/2018  Medication Sig  . lisinopril-hydrochlorothiazide (PRINZIDE,ZESTORETIC) 20-12.5 MG tablet Take by mouth.  . metFORMIN (GLUMETZA) 500 MG (MOD) 24 hr tablet Take 500 mg by mouth every evening.   . norethindrone (AYGESTIN) 5 MG tablet TAKE ONE TABLET DAILY  . omeprazole (PRILOSEC) 40 MG capsule Take 40 mg by mouth every morning.   . simvastatin (ZOCOR) 40 MG tablet Take 40 mg by mouth every evening.   . Blood Glucose Monitoring Suppl (ONE TOUCH ULTRA 2) w/Device KIT 1 each by Does not apply route 2 (two) times daily.  Marland Kitchen BYDUREON 2 MG PEN INJECT 2 MG SUBCUTANEOUSLY ONCE A WEEK  . glucose blood (ONE TOUCH ULTRA TEST) test strip Use as instructed  . Lancets  (ONETOUCH ULTRASOFT) lancets Use as instructed  . Semaglutide (RYBELSUS) 7 MG TABS Take 7 mg by mouth daily.  . [DISCONTINUED] lisinopril (PRINIVIL,ZESTRIL) 40 MG tablet Take 40 mg by mouth every morning.    No facility-administered encounter medications on file as of 10/04/2018.     Surgical History: Past Surgical History:  Procedure Laterality Date  . COLONOSCOPY    . DILATATION & CURETTAGE/HYSTEROSCOPY WITH MYOSURE N/A 11/28/2014   Procedure: DILATATION & CURETTAGE/HYSTEROSCOPY WITH MYOSURE;  Surgeon: Will Bonnet, MD;  Location: [facility] ORS;  Service: Gynecology;  Laterality: N/A;  . DILATION AND CURETTAGE OF UTERUS    . NOVASURE ABLATION      Medical History: Past Medical History:  Diagnosis Date  . Abnormal uterine bleeding (AUB)   . Anemia   . Chronic kidney disease   . DM (diabetes mellitus) ([place])   . Fibroid   . GERD (gastroesophageal reflux disease)   . Headache    h/o  . Hyperlipidemia   . Hypertension   . Metrorrhagia   . Obesity    bmi 54  . PCOS (polycystic ovarian syndrome)   . S/P endometrial ablation   . Vaginal Pap smear, abnormal 2009   pos hvp    Family History: Family History  Problem Relation Age of Onset  . Colon cancer Mother 61  . Colon cancer Father 72  . Breast cancer Maternal Grandmother 70    Social History  Socioeconomic History  . Marital status: Single    Spouse name: Not on file  . Number of children: Not on file  . Years of education: Not on file  . Highest education level: Not on file  Occupational History  . Not on file  Social Needs  . Financial resource strain: Not on file  . Food insecurity:    Worry: Not on file    Inability: Not on file  . Transportation needs:    Medical: Not on file    Non-medical: Not on file  Tobacco Use  . Smoking status: Never Smoker  . Smokeless tobacco: Never Used  Substance and Sexual Activity  . Alcohol use: Yes    Comment: socially  . Drug use: No  . Sexual activity: Yes     Partners: Male    Birth control/protection: None  Lifestyle  . Physical activity:    Days per week: Not on file    Minutes per session: Not on file  . Stress: Not on file  Relationships  . Social connections:    Talks on phone: Not on file    Gets together: Not on file    Attends religious service: Not on file    Active member of club or organization: Not on file    Attends meetings of clubs or organizations: Not on file    Relationship status: Not on file  . Intimate partner violence:    Fear of current or ex partner: Not on file    Emotionally abused: Not on file    Physically abused: Not on file    Forced sexual activity: Not on file  Other Topics Concern  . Not on file  Social History Narrative  . Not on file     Review of Systems  Constitutional: Negative for chills, fatigue and unexpected weight change.  HENT: Negative for congestion, rhinorrhea, sneezing and sore throat.   Eyes: Negative for photophobia, pain and redness.  Respiratory: Negative for cough, chest tightness and shortness of breath.   Cardiovascular: Negative for chest pain and palpitations.  Gastrointestinal: Negative for abdominal pain, constipation, diarrhea, nausea and vomiting.  Endocrine: Negative.   Genitourinary: Negative for dysuria and frequency.  Musculoskeletal: Negative for arthralgias, back pain, joint swelling and neck pain.  Skin: Negative for rash.  Allergic/Immunologic: Negative.   Neurological: Negative for tremors and numbness.  Hematological: Negative for adenopathy. Does not bruise/bleed easily.  Psychiatric/Behavioral: Negative for behavioral problems and sleep disturbance. The patient is not nervous/anxious.     Vital Signs: BP 130/80   Pulse 75   Resp 16   Ht '5\' 4"'  (1.626 m)   Wt (!) 317 lb (143.8 kg)   SpO2 98%   BMI 54.41 kg/m    Physical Exam Vitals signs and nursing note reviewed.  Constitutional:      General: She is not in acute distress.    Appearance: She  is well-developed. She is not diaphoretic.  HENT:     Head: Normocephalic and atraumatic.     Mouth/Throat:     Pharynx: No oropharyngeal exudate.  Eyes:     Pupils: Pupils are equal, round, and reactive to light.  Neck:     Musculoskeletal: Normal range of motion and neck supple.     Thyroid: No thyromegaly.     Vascular: No JVD.     Trachea: No tracheal deviation.  Cardiovascular:     Rate and Rhythm: Normal rate and regular rhythm.     Heart sounds:  Normal heart sounds. No murmur. No friction rub. No gallop.   Pulmonary:     Effort: Pulmonary effort is normal. No respiratory distress.     Breath sounds: Normal breath sounds. No wheezing or rales.  Chest:     Chest wall: No tenderness.  Abdominal:     Palpations: Abdomen is soft.     Tenderness: There is no abdominal tenderness. There is no guarding.  Musculoskeletal: Normal range of motion.  Lymphadenopathy:     Cervical: No cervical adenopathy.  Skin:    General: Skin is warm and dry.  Neurological:     Mental Status: She is alert and oriented to person, place, and time.     Cranial Nerves: No cranial nerve deficit.  Psychiatric:        Behavior: Behavior normal.        Thought Content: Thought content normal.        Judgment: Judgment normal.    Assessment/Plan: 1. Uncontrolled type 2 diabetes mellitus with hyperglycemia ([place]) Patient's A1c in office today is 9.7.  Patient's previous A1c's have been greater than 8 for the last year.  We will add Rybelsus at this time.  Patient given 30-day sample for initial 24m dosing.  Patient also prescribed onetough glucometer so that she can take her blood sugar in the morning before breakfast as well as in the evening. - POCT HgB A1C - Semaglutide (RYBELSUS) 7 MG TABS; Take 7 mg by mouth daily.  Dispense: 30 tablet; Refill: 1 - Blood Glucose Monitoring Suppl (ONE TOUCH ULTRA 2) w/Device KIT; 1 each by Does not apply route 2 (two) times daily.  Dispense: 1 each; Refill: 0 -  Lancets (ONETOUCH ULTRASOFT) lancets; Use as instructed  Dispense: 100 each; Refill: 12 - glucose blood (ONE TOUCH ULTRA TEST) test strip; Use as instructed  Dispense: 100 each; Refill: 12  2. Hypertension, unspecified type Blood pressures well controlled today 130/80 but continue present management.  3. Hyperlipidemia, unspecified hyperlipidemia type Stable per last blood draw at patient's previous PCP.  Continue Zocor at this time.  4. OSA on CPAP Patient uses CPAP nightly without difficulty.  5. PCOS (polycystic ovarian syndrome) Currently taking metformin 1000 mg twice daily.  Appears controlled at this time.  6. Elevated TSH Patient has had elevated TSH at previous physician.  No free T4 was ever drawn.  Those labs will be repeated at this time for baseline in our office.  7. Class 3 severe obesity due to excess calories with serious comorbidity and body mass index (BMI) of 50.0 to 59.9 in adult ([facility] Obesity Counseling: Risk Assessment: An assessment of behavioral risk factors was made today and includes lack of exercise sedentary lifestyle, lack of portion control and poor dietary habits.  Risk Modification Advice: She was counseled on portion control guidelines. Restricting daily caloric intake to. . The detrimental long term effects of obesity on her health and ongoing poor compliance was also discussed with the patient.  General Counseling: JTiaunaverbalizes understanding of the findings of todays visit and agrees with plan of treatment. I have discussed any further diagnostic evaluation that may be needed or ordered today. We also reviewed her medications today. she has been encouraged to call the office with any questions or concerns that should arise related to todays visit.  Orders Placed This Encounter  Procedures  . POCT HgB A1C    Meds ordered this encounter  Medications  . Semaglutide (RYBELSUS) 7 MG TABS    Sig: Take 7  mg by mouth daily.    Dispense:  30 tablet     Refill:  1  . Blood Glucose Monitoring Suppl (ONE TOUCH ULTRA 2) w/Device KIT    Sig: 1 each by Does not apply route 2 (two) times daily.    Dispense:  1 each    Refill:  0  . Lancets (ONETOUCH ULTRASOFT) lancets    Sig: Use as instructed    Dispense:  100 each    Refill:  12  . glucose blood (ONE TOUCH ULTRA TEST) test strip    Sig: Use as instructed    Dispense:  100 each    Refill:  12    Time spent: 35 Minutes   This patient was seen by Orson Gear AGNP-C in Collaboration with Dr Lavera Guise as a part of collaborative care agreement  Kendell Bane AGNP-C Internal Medicine

## 2018-10-05 LAB — T4, FREE: FREE T4: 1.18 ng/dL (ref 0.82–1.77)

## 2018-10-05 LAB — T3: T3 TOTAL: 107 ng/dL (ref 71–180)

## 2018-10-05 LAB — TSH: TSH: 2.17 u[IU]/mL (ref 0.450–4.500)

## 2018-10-15 ENCOUNTER — Telehealth: Payer: Self-pay

## 2018-10-15 ENCOUNTER — Other Ambulatory Visit: Payer: Self-pay

## 2018-10-15 MED ORDER — PNEUMOCOCCAL 13-VAL CONJ VACC IM SUSP: 0.5000 mL | INTRAMUSCULAR | 0 refills | Status: AC

## 2018-10-15 NOTE — Telephone Encounter (Signed)
Send message to [employer]

## 2018-10-16 ENCOUNTER — Encounter: Payer: Self-pay | Admitting: Adult Health

## 2018-11-07 ENCOUNTER — Encounter: Payer: Self-pay | Admitting: Adult Health

## 2018-12-14 ENCOUNTER — Other Ambulatory Visit: Payer: Self-pay | Admitting: Obstetrics and Gynecology

## 2019-01-03 ENCOUNTER — Other Ambulatory Visit: Payer: Self-pay | Admitting: Adult Health

## 2019-01-03 DIAGNOSIS — E1165 Type 2 diabetes mellitus with hyperglycemia: Secondary | ICD-10-CM

## 2019-01-08 ENCOUNTER — Encounter: Payer: Self-pay | Admitting: Adult Health

## 2019-01-08 ENCOUNTER — Ambulatory Visit: Payer: 59 | Admitting: Adult Health

## 2019-01-08 ENCOUNTER — Other Ambulatory Visit: Payer: Self-pay

## 2019-01-08 VITALS — BP 127/81 | HR 73 | Resp 16 | Ht 64.0 in | Wt 312.0 lb

## 2019-01-08 DIAGNOSIS — Z6841 Body Mass Index (BMI) 40.0 and over, adult: Secondary | ICD-10-CM

## 2019-01-08 DIAGNOSIS — E1165 Type 2 diabetes mellitus with hyperglycemia: Secondary | ICD-10-CM | POA: Diagnosis not present

## 2019-01-08 DIAGNOSIS — E785 Hyperlipidemia, unspecified: Secondary | ICD-10-CM

## 2019-01-08 DIAGNOSIS — Z9989 Dependence on other enabling machines and devices: Secondary | ICD-10-CM

## 2019-01-08 DIAGNOSIS — G4733 Obstructive sleep apnea (adult) (pediatric): Secondary | ICD-10-CM | POA: Diagnosis not present

## 2019-01-08 DIAGNOSIS — I10 Essential (primary) hypertension: Secondary | ICD-10-CM

## 2019-01-08 LAB — POCT GLYCOSYLATED HEMOGLOBIN (HGB A1C): Hemoglobin A1C: 7.6 % — AB (ref 4.0–5.6)

## 2019-01-08 MED ORDER — RYBELSUS 14 MG PO TABS: 1.0000 | ORAL_TABLET | Freq: Every day | ORAL | 3 refills | Status: DC

## 2019-01-08 NOTE — Patient Instructions (Signed)
Diabetes Mellitus and Nutrition, Adult  When you have diabetes (diabetes mellitus), it is very important to have healthy eating habits because your blood sugar (glucose) levels are greatly affected by what you eat and drink. Eating healthy foods in the appropriate amounts, at about the same times every day, can help you:  · Control your blood glucose.  · Lower your risk of heart disease.  · Improve your blood pressure.  · Reach or maintain a healthy weight.  Every person with diabetes is different, and each person has different needs for a meal plan. Your health care provider may recommend that you work with a diet and nutrition specialist (dietitian) to make a meal plan that is best for you. Your meal plan may vary depending on factors such as:  · The calories you need.  · The medicines you take.  · Your weight.  · Your blood glucose, blood pressure, and cholesterol levels.  · Your activity level.  · Other health conditions you have, such as heart or kidney disease.  How do carbohydrates affect me?  Carbohydrates, also called carbs, affect your blood glucose level more than any other type of food. Eating carbs naturally raises the amount of glucose in your blood. Carb counting is a method for keeping track of how many carbs you eat. Counting carbs is important to keep your blood glucose at a healthy level, especially if you use insulin or take certain oral diabetes medicines.  It is important to know how many carbs you can safely have in each meal. This is different for every person. Your dietitian can help you calculate how many carbs you should have at each meal and for each snack.  Foods that contain carbs include:  · Bread, cereal, rice, pasta, and crackers.  · Potatoes and corn.  · Peas, beans, and lentils.  · Milk and yogurt.  · Fruit and juice.  · Desserts, such as cakes, cookies, ice cream, and candy.  How does alcohol affect me?  Alcohol can cause a sudden decrease in blood glucose (hypoglycemia),  especially if you use insulin or take certain oral diabetes medicines. Hypoglycemia can be a life-threatening condition. Symptoms of hypoglycemia (sleepiness, dizziness, and confusion) are similar to symptoms of having too much alcohol.  If your health care provider says that alcohol is safe for you, follow these guidelines:  · Limit alcohol intake to no more than 1 drink per day for nonpregnant women and 2 drinks per day for men. One drink equals 12 oz of beer, 5 oz of wine, or 1½ oz of hard liquor.  · Do not drink on an empty stomach.  · Keep yourself hydrated with water, diet soda, or unsweetened iced tea.  · Keep in mind that regular soda, juice, and other mixers may contain a lot of sugar and must be counted as carbs.  What are tips for following this plan?    Reading food labels  · Start by checking the serving size on the "Nutrition Facts" label of packaged foods and drinks. The amount of calories, carbs, fats, and other nutrients listed on the label is based on one serving of the item. Many items contain more than one serving per package.  · Check the total grams (g) of carbs in one serving. You can calculate the number of servings of carbs in one serving by dividing the total carbs by 15. For example, if a food has 30 g of total carbs, it would be equal to 2   servings of carbs.  · Check the number of grams (g) of saturated and trans fats in one serving. Choose foods that have low or no amount of these fats.  · Check the number of milligrams (mg) of salt (sodium) in one serving. Most people should limit total sodium intake to less than 2,300 mg per day.  · Always check the nutrition information of foods labeled as "low-fat" or "nonfat". These foods may be higher in added sugar or refined carbs and should be avoided.  · Talk to your dietitian to identify your daily goals for nutrients listed on the label.  Shopping  · Avoid buying canned, premade, or processed foods. These foods tend to be high in fat, sodium,  and added sugar.  · Shop around the outside edge of the grocery store. This includes fresh fruits and vegetables, bulk grains, fresh meats, and fresh dairy.  Cooking  · Use low-heat cooking methods, such as baking, instead of high-heat cooking methods like deep frying.  · Cook using healthy oils, such as olive, canola, or sunflower oil.  · Avoid cooking with butter, cream, or high-fat meats.  Meal planning  · Eat meals and snacks regularly, preferably at the same times every day. Avoid going long periods of time without eating.  · Eat foods high in fiber, such as fresh fruits, vegetables, beans, and whole grains. Talk to your dietitian about how many servings of carbs you can eat at each meal.  · Eat 4-6 ounces (oz) of lean protein each day, such as lean meat, chicken, fish, eggs, or tofu. One oz of lean protein is equal to:  ? 1 oz of meat, chicken, or fish.  ? 1 egg.  ? ¼ cup of tofu.  · Eat some foods each day that contain healthy fats, such as avocado, nuts, seeds, and fish.  Lifestyle  · Check your blood glucose regularly.  · Exercise regularly as told by your health care provider. This may include:  ? 150 minutes of moderate-intensity or vigorous-intensity exercise each week. This could be brisk walking, biking, or water aerobics.  ? Stretching and doing strength exercises, such as yoga or weightlifting, at least 2 times a week.  · Take medicines as told by your health care provider.  · Do not use any products that contain nicotine or tobacco, such as cigarettes and e-cigarettes. If you need help quitting, ask your health care provider.  · Work with a counselor or diabetes educator to identify strategies to manage stress and any emotional and social challenges.  Questions to ask a health care provider  · Do I need to meet with a diabetes educator?  · Do I need to meet with a dietitian?  · What number can I call if I have questions?  · When are the best times to check my blood glucose?  Where to find more  information:  · American Diabetes Association: diabetes.org  · Academy of Nutrition and Dietetics: www.eatright.org  · National Institute of Diabetes and Digestive and Kidney Diseases (NIH): www.niddk.nih.gov  Summary  · A healthy meal plan will help you control your blood glucose and maintain a healthy lifestyle.  · Working with a diet and nutrition specialist (dietitian) can help you make a meal plan that is best for you.  · Keep in mind that carbohydrates (carbs) and alcohol have immediate effects on your blood glucose levels. It is important to count carbs and to use alcohol carefully.  This information is not intended to   replace advice given to you by your health care provider. Make sure you discuss any questions you have with your health care provider.  Document Released: 03/31/2005 Document Revised: 02/01/2017 Document Reviewed: 08/08/2016  Elsevier Interactive Patient Education © 2019 Elsevier Inc.

## 2019-01-08 NOTE — Progress Notes (Signed)
[facility] [place], [place] [address]  Internal MEDICINE  Office Visit Note  Patient Name: Rachel Gordon  [id number]  [id number]  Date of Service: 01/08/2019  Chief Complaint  Patient presents with  . Diabetes  . Hypertension  . Hyperlipidemia  . Quality Metric Gaps    EYE exam and Pneumonia wax    HPI  Pt is here for follow up on diabetes, HTN and HLD. Her a1C today is down to 7.6.  She reports she has been walking, and taking her medicine.  She is feeling well and denies any current issues. Her blood pressure has bene well controlled.  She is in need of an eye exam and pneumonia vaccine to close her quality metric gaps.       Current Medication: Outpatient Encounter Medications as of 01/08/2019  Medication Sig  . Blood Glucose Monitoring Suppl (ONE TOUCH ULTRA 2) w/Device KIT 1 each by Does not apply route 2 (two) times daily.  Marland Kitchen BYDUREON 2 MG PEN INJECT 2 MG SUBCUTANEOUSLY ONCE A WEEK  . glucose blood (ONE TOUCH ULTRA TEST) test strip Use as instructed  . Lancets (ONETOUCH ULTRASOFT) lancets Use as instructed  . lisinopril-hydrochlorothiazide (PRINZIDE,ZESTORETIC) 20-12.5 MG tablet Take by mouth.  . metFORMIN (GLUMETZA) 500 MG (MOD) 24 hr tablet Take 2 tab in morning and 2 tab at night  . norethindrone (AYGESTIN) 5 MG tablet TAKE ONE TABLET DAILY  . omeprazole (PRILOSEC) 40 MG capsule Take 40 mg by mouth daily.   . RYBELSUS 7 MG TABS TAKE 1 TABLET BY MOUTH EVERY DAY  . simvastatin (ZOCOR) 40 MG tablet Take 40 mg by mouth every evening.    No facility-administered encounter medications on file as of 01/08/2019.     Surgical History: Past Surgical History:  Procedure Laterality Date  . COLONOSCOPY    . DILATATION & CURETTAGE/HYSTEROSCOPY WITH MYOSURE N/A 11/28/2014   Procedure: DILATATION & CURETTAGE/HYSTEROSCOPY WITH MYOSURE;  Surgeon: Will Bonnet, MD;  Location: [facility] ORS;  Service: Gynecology;  Laterality: N/A;  . DILATION AND  CURETTAGE OF UTERUS    . NOVASURE ABLATION      Medical History: Past Medical History:  Diagnosis Date  . Abnormal uterine bleeding (AUB)   . Anemia   . Chronic kidney disease   . DM (diabetes mellitus) ([place])   . Fibroid   . GERD (gastroesophageal reflux disease)   . Headache    h/o  . Hyperlipidemia   . Hypertension   . Metrorrhagia   . Obesity    bmi 54  . PCOS (polycystic ovarian syndrome)   . S/P endometrial ablation   . Vaginal Pap smear, abnormal 2009   pos hvp    Family History: Family History  Problem Relation Age of Onset  . Colon cancer Mother 87  . Colon cancer Father 58  . Breast cancer Maternal Grandmother 70    Social History   Socioeconomic History  . Marital status: Single    Spouse name: Not on file  . Number of children: Not on file  . Years of education: Not on file  . Highest education level: Not on file  Occupational History  . Not on file  Social Needs  . Financial resource strain: Not on file  . Food insecurity    Worry: Not on file    Inability: Not on file  . Transportation needs    Medical: Not on file    Non-medical: Not on file  Tobacco Use  .  Smoking status: Never Smoker  . Smokeless tobacco: Never Used  Substance and Sexual Activity  . Alcohol use: Yes    Comment: socially  . Drug use: No  . Sexual activity: Yes    Partners: Male    Birth control/protection: None  Lifestyle  . Physical activity    Days per week: Not on file    Minutes per session: Not on file  . Stress: Not on file  Relationships  . Social Herbalist on phone: Not on file    Gets together: Not on file    Attends religious service: Not on file    Active member of club or organization: Not on file    Attends meetings of clubs or organizations: Not on file    Relationship status: Not on file  . Intimate partner violence    Fear of current or ex partner: Not on file    Emotionally abused: Not on file    Physically abused: Not on file     Forced sexual activity: Not on file  Other Topics Concern  . Not on file  Social History Narrative  . Not on file      Review of Systems  Constitutional: Negative for chills, fatigue and unexpected weight change.  HENT: Negative for congestion, rhinorrhea, sneezing and sore throat.   Eyes: Negative for photophobia, pain and redness.  Respiratory: Negative for cough, chest tightness and shortness of breath.   Cardiovascular: Negative for chest pain and palpitations.  Gastrointestinal: Negative for abdominal pain, constipation, diarrhea, nausea and vomiting.  Endocrine: Negative.   Genitourinary: Negative for dysuria and frequency.  Musculoskeletal: Negative for arthralgias, back pain, joint swelling and neck pain.  Skin: Negative for rash.  Allergic/Immunologic: Negative.   Neurological: Negative for tremors and numbness.  Hematological: Negative for adenopathy. Does not bruise/bleed easily.  Psychiatric/Behavioral: Negative for behavioral problems and sleep disturbance. The patient is not nervous/anxious.     Vital Signs: BP 127/81   Pulse 73   Resp 16   Ht '5\' 4"'  (1.626 m)   Wt (!) 312 lb (141.5 kg)   SpO2 98%   BMI 53.55 kg/m    Physical Exam Vitals signs and nursing note reviewed.  Constitutional:      General: She is not in acute distress.    Appearance: She is well-developed. She is not diaphoretic.  HENT:     Head: Normocephalic and atraumatic.     Mouth/Throat:     Pharynx: No oropharyngeal exudate.  Eyes:     Pupils: Pupils are equal, round, and reactive to light.  Neck:     Musculoskeletal: Normal range of motion and neck supple.     Thyroid: No thyromegaly.     Vascular: No JVD.     Trachea: No tracheal deviation.  Cardiovascular:     Rate and Rhythm: Normal rate and regular rhythm.     Heart sounds: Normal heart sounds. No murmur. No friction rub. No gallop.   Pulmonary:     Effort: Pulmonary effort is normal. No respiratory distress.     Breath  sounds: Normal breath sounds. No wheezing or rales.  Chest:     Chest wall: No tenderness.  Abdominal:     Palpations: Abdomen is soft.     Tenderness: There is no abdominal tenderness. There is no guarding.  Musculoskeletal: Normal range of motion.  Lymphadenopathy:     Cervical: No cervical adenopathy.  Skin:    General: Skin is warm and  dry.  Neurological:     Mental Status: She is alert and oriented to person, place, and time.     Cranial Nerves: No cranial nerve deficit.  Psychiatric:        Behavior: Behavior normal.        Thought Content: Thought content normal.        Judgment: Judgment normal.    Assessment/Plan: 1. Uncontrolled type 2 diabetes mellitus with hyperglycemia (HCC) Improved a1c.  Continue medications as discussed. Continue to exercise and dietary modification.  - POCT HgB A1C - Semaglutide (RYBELSUS) 14 MG TABS; Take 1 tablet by mouth daily.  Dispense: 30 tablet; Refill: 3  2. Hypertension, unspecified type Stable, continue present management.   3. Hyperlipidemia, unspecified hyperlipidemia type Stable, continue present management.  4. OSA on CPAP Continue to use cpap as discussed.   5. Class 3 severe obesity due to excess calories with serious comorbidity and body mass index (BMI) of 50.0 to 59.9 in adult [facility]) Discussed bariatric surgery options.  Pt would like to know more, and a referral to general surgery has been made.  - Ambulatory referral to General Surgery  General Counseling: Ekta verbalizes understanding of the findings of todays visit and agrees with plan of treatment. I have discussed any further diagnostic evaluation that may be needed or ordered today. We also reviewed her medications today. she has been encouraged to call the office with any questions or concerns that should arise related to todays visit.    Orders Placed This Encounter  Procedures  . POCT HgB A1C    No orders of the defined types were placed in this  encounter.   Time spent:20 Minutes   This patient was seen by Orson Gear AGNP-C in Collaboration with Dr Lavera Guise as a part of collaborative care agreement     Kendell Bane AGNP-C Internal medicine

## 2019-02-07 ENCOUNTER — Other Ambulatory Visit: Payer: Self-pay | Admitting: Adult Health

## 2019-02-07 MED ORDER — AMOXICILLIN-POT CLAVULANATE 875-125 MG PO TABS: 1.0000 | ORAL_TABLET | Freq: Two times a day (BID) | ORAL | 0 refills | Status: DC

## 2019-02-07 NOTE — Progress Notes (Signed)
Dental abscess.  Sent Augmentin to patients pharmacy.

## 2019-03-24 ENCOUNTER — Other Ambulatory Visit: Payer: Self-pay | Admitting: Internal Medicine

## 2019-03-24 DIAGNOSIS — E1165 Type 2 diabetes mellitus with hyperglycemia: Secondary | ICD-10-CM

## 2019-04-11 ENCOUNTER — Other Ambulatory Visit: Payer: Self-pay

## 2019-04-11 ENCOUNTER — Ambulatory Visit: Payer: 59 | Admitting: Adult Health

## 2019-04-11 ENCOUNTER — Encounter: Payer: Self-pay | Admitting: Adult Health

## 2019-04-11 VITALS — BP 122/82 | HR 78 | Resp 16 | Ht 64.0 in | Wt 296.0 lb

## 2019-04-11 DIAGNOSIS — E1165 Type 2 diabetes mellitus with hyperglycemia: Secondary | ICD-10-CM

## 2019-04-11 DIAGNOSIS — G4733 Obstructive sleep apnea (adult) (pediatric): Secondary | ICD-10-CM | POA: Diagnosis not present

## 2019-04-11 DIAGNOSIS — I10 Essential (primary) hypertension: Secondary | ICD-10-CM | POA: Diagnosis not present

## 2019-04-11 DIAGNOSIS — Z23 Encounter for immunization: Secondary | ICD-10-CM

## 2019-04-11 DIAGNOSIS — Z9989 Dependence on other enabling machines and devices: Secondary | ICD-10-CM

## 2019-04-11 DIAGNOSIS — E785 Hyperlipidemia, unspecified: Secondary | ICD-10-CM

## 2019-04-11 DIAGNOSIS — Z6841 Body Mass Index (BMI) 40.0 and over, adult: Secondary | ICD-10-CM

## 2019-04-11 LAB — POCT GLYCOSYLATED HEMOGLOBIN (HGB A1C): Hemoglobin A1C: 6.5 % — AB (ref 4.0–5.6)

## 2019-04-11 NOTE — Progress Notes (Signed)
[facility] [place], [place] [address]  Internal MEDICINE  Office Visit Note  Patient Name: Rachel Gordon  [id number]  [id number]  Date of Service: 04/11/2019  Chief Complaint  Patient presents with  . Medical Management of Chronic Issues    3 month follow up   . Diabetes  . Hyperlipidemia    HPI  Pt is seen for follow up on DM, and hyperlipidemia. Overall she is doing excellent. She has lost 16 pounds, and her A1C is now 6.5.  She denies any other symptoms. She has an appointment with bariatric surgeon in one week.  She has been walking, and denies any issues.      Current Medication: Outpatient Encounter Medications as of 04/11/2019  Medication Sig  . Blood Glucose Monitoring Suppl (ONE TOUCH ULTRA 2) w/Device KIT 1 each by Does not apply route 2 (two) times daily.  Marland Kitchen BYDUREON 2 MG PEN INJECT 2 MG SUBCUTANEOUSLY ONCE A WEEK  . glucose blood (ONE TOUCH ULTRA TEST) test strip Use as instructed  . Lancets (ONETOUCH ULTRASOFT) lancets Use as instructed  . lisinopril-hydrochlorothiazide (PRINZIDE,ZESTORETIC) 20-12.5 MG tablet Take by mouth.  . metFORMIN (GLUMETZA) 500 MG (MOD) 24 hr tablet Take 2 tab in morning and 2 tab at night  . norethindrone (AYGESTIN) 5 MG tablet TAKE ONE TABLET DAILY  . omeprazole (PRILOSEC) 40 MG capsule Take 40 mg by mouth daily.   . Semaglutide (RYBELSUS) 14 MG TABS Take 1 tablet by mouth daily.  . simvastatin (ZOCOR) 40 MG tablet Take 40 mg by mouth every evening.   . [DISCONTINUED] amoxicillin-clavulanate (AUGMENTIN) 875-125 MG tablet Take 1 tablet by mouth 2 (two) times daily. (Patient not taking: Reported on 04/11/2019)   No facility-administered encounter medications on file as of 04/11/2019.     Surgical History: Past Surgical History:  Procedure Laterality Date  . COLONOSCOPY    . DILATATION & CURETTAGE/HYSTEROSCOPY WITH MYOSURE N/A 11/28/2014   Procedure: DILATATION & CURETTAGE/HYSTEROSCOPY WITH MYOSURE;   Surgeon: Will Bonnet, MD;  Location: [facility] ORS;  Service: Gynecology;  Laterality: N/A;  . DILATION AND CURETTAGE OF UTERUS    . NOVASURE ABLATION      Medical History: Past Medical History:  Diagnosis Date  . Abnormal uterine bleeding (AUB)   . Anemia   . Chronic kidney disease   . DM (diabetes mellitus) ([place])   . Fibroid   . GERD (gastroesophageal reflux disease)   . Headache    h/o  . Hyperlipidemia   . Hypertension   . Metrorrhagia   . Obesity    bmi 54  . PCOS (polycystic ovarian syndrome)   . S/P endometrial ablation   . Vaginal Pap smear, abnormal 2009   pos hvp    Family History: Family History  Problem Relation Age of Onset  . Colon cancer Mother 52  . Colon cancer Father 59  . Breast cancer Maternal Grandmother 70    Social History   Socioeconomic History  . Marital status: Single    Spouse name: Not on file  . Number of children: Not on file  . Years of education: Not on file  . Highest education level: Not on file  Occupational History  . Not on file  Social Needs  . Financial resource strain: Not on file  . Food insecurity    Worry: Not on file    Inability: Not on file  . Transportation needs    Medical: Not on file  Non-medical: Not on file  Tobacco Use  . Smoking status: Never Smoker  . Smokeless tobacco: Never Used  Substance and Sexual Activity  . Alcohol use: Yes    Comment: socially  . Drug use: No  . Sexual activity: Yes    Partners: Male    Birth control/protection: None  Lifestyle  . Physical activity    Days per week: Not on file    Minutes per session: Not on file  . Stress: Not on file  Relationships  . Social Herbalist on phone: Not on file    Gets together: Not on file    Attends religious service: Not on file    Active member of club or organization: Not on file    Attends meetings of clubs or organizations: Not on file    Relationship status: Not on file  . Intimate partner violence    Fear of  current or ex partner: Not on file    Emotionally abused: Not on file    Physically abused: Not on file    Forced sexual activity: Not on file  Other Topics Concern  . Not on file  Social History Narrative  . Not on file      Review of Systems  Constitutional: Negative for chills, fatigue and unexpected weight change.  HENT: Negative for congestion, rhinorrhea, sneezing and sore throat.   Eyes: Negative for photophobia, pain and redness.  Respiratory: Negative for cough, chest tightness and shortness of breath.   Cardiovascular: Negative for chest pain and palpitations.  Gastrointestinal: Negative for abdominal pain, constipation, diarrhea, nausea and vomiting.  Endocrine: Negative.   Genitourinary: Negative for dysuria and frequency.  Musculoskeletal: Negative for arthralgias, back pain, joint swelling and neck pain.  Skin: Negative for rash.  Allergic/Immunologic: Negative.   Neurological: Negative for tremors and numbness.  Hematological: Negative for adenopathy. Does not bruise/bleed easily.  Psychiatric/Behavioral: Negative for behavioral problems and sleep disturbance. The patient is not nervous/anxious.     Vital Signs: BP 122/82   Pulse 78   Resp 16   Ht '5\' 4"'  (1.626 m)   Wt 296 lb (134.3 kg)   SpO2 98%   BMI 50.81 kg/m    Physical Exam Vitals signs and nursing note reviewed.  Constitutional:      General: She is not in acute distress.    Appearance: She is well-developed. She is not diaphoretic.  HENT:     Head: Normocephalic and atraumatic.     Mouth/Throat:     Pharynx: No oropharyngeal exudate.  Eyes:     Pupils: Pupils are equal, round, and reactive to light.  Neck:     Musculoskeletal: Normal range of motion and neck supple.     Thyroid: No thyromegaly.     Vascular: No JVD.     Trachea: No tracheal deviation.  Cardiovascular:     Rate and Rhythm: Normal rate and regular rhythm.     Heart sounds: Normal heart sounds. No murmur. No friction rub.  No gallop.   Pulmonary:     Effort: Pulmonary effort is normal. No respiratory distress.     Breath sounds: Normal breath sounds. No wheezing or rales.  Chest:     Chest wall: No tenderness.  Abdominal:     Palpations: Abdomen is soft.     Tenderness: There is no abdominal tenderness. There is no guarding.  Musculoskeletal: Normal range of motion.  Lymphadenopathy:     Cervical: No cervical adenopathy.  Skin:  General: Skin is warm and dry.  Neurological:     Mental Status: She is alert and oriented to person, place, and time.     Cranial Nerves: No cranial nerve deficit.  Psychiatric:        Behavior: Behavior normal.        Thought Content: Thought content normal.        Judgment: Judgment normal.    Assessment/Plan: 1. Uncontrolled type 2 diabetes mellitus with hyperglycemia (HCC) A1C is 6.5. Drastically improved.  Continue current regimen.    - POCT HgB A1C  2. Hypertension, unspecified type Stable, continue present therapy.   3. Hyperlipidemia, unspecified hyperlipidemia type Continue present therapy.   4. OSA on CPAP Well controlled, using cpap, will continue present therapy.   5. Class 3 severe obesity due to excess calories with serious comorbidity and body mass index (BMI) of 50.0 to 59.9 in adult [facility]) Obesity Counseling: Risk Assessment: An assessment of behavioral risk factors was made today and includes lack of exercise sedentary lifestyle, lack of portion control and poor dietary habits.  Risk Modification Advice: She was counseled on portion control guidelines. Restricting daily caloric intake to. . The detrimental long term effects of obesity on her health and ongoing poor compliance was also discussed with the patient.    General Counseling: Alexica verbalizes understanding of the findings of todays visit and agrees with plan of treatment. I have discussed any further diagnostic evaluation that may be needed or ordered today. We also reviewed her  medications today. she has been encouraged to call the office with any questions or concerns that should arise related to todays visit.    Orders Placed This Encounter  Procedures  . POCT HgB A1C    No orders of the defined types were placed in this encounter.   Time spent: 15 Minutes   This patient was seen by Orson Gear AGNP-C in Collaboration with Dr Lavera Guise as a part of collaborative care agreement     Kendell Bane AGNP-C Internal medicine

## 2019-04-19 ENCOUNTER — Other Ambulatory Visit: Payer: Self-pay | Admitting: Surgery

## 2019-04-22 ENCOUNTER — Other Ambulatory Visit: Payer: Self-pay | Admitting: Surgery

## 2019-05-23 ENCOUNTER — Encounter: Payer: 59 | Attending: Surgery | Admitting: Skilled Nursing Facility1

## 2019-05-23 ENCOUNTER — Ambulatory Visit
Admission: RE | Admit: 2019-05-23 | Discharge: 2019-05-23 | Disposition: A | Discharge status: Home / Self Care | Payer: 59 | Source: Ambulatory Visit | Attending: Surgery | Admitting: Surgery

## 2019-05-23 ENCOUNTER — Other Ambulatory Visit: Payer: Self-pay

## 2019-05-23 ENCOUNTER — Encounter: Payer: Self-pay | Admitting: Skilled Nursing Facility1

## 2019-05-23 DIAGNOSIS — N182 Chronic kidney disease, stage 2 (mild): Secondary | ICD-10-CM

## 2019-05-23 DIAGNOSIS — E1122 Type 2 diabetes mellitus with diabetic chronic kidney disease: Secondary | ICD-10-CM

## 2019-05-23 NOTE — Progress Notes (Signed)
Nutrition Assessment for Bariatric Surgery Medical Nutrition Therapy Appt Start Time: 12:50  End Time: 1:50  Patient was seen on 05/23/2019 for Pre-Operative Nutrition Assessment. Letter of approval faxed to [facility] bariatric surgery program coordinator on 05/23/2019.   Referral stated Supervised Weight Loss (SWL) visits needed: 0  Planned surgery: sleeve gastrectomy  Pt expectation of surgery: to live longer than 53 years old Pt expectation of dietitian: educate     NUTRITION ASSESSMENT   Anthropometrics  Start weight at [facility]: 297 lbs (date: 05/23/2019)  Height: 64 in BMI: 50.98 kg/m2     Clinical  Medical hx: PCOS, diabetes, hypertension, hypercholesterolemia, sleep apnea  Medications: metformin, lisinopril, simvastatin, bydureon, norethindrone  Labs:  Notable signs/symptoms:   Lifestyle & Dietary Hx (living situation, sleep regimen, functional ability, weight hx, current dietary patterns, fluid intake, supplements, physical activity, etc)  Pt states she wears her C-PAP every night. Pt states she has had diabetes since 2002 checks blood sugars every morning fasting 154, typically in the 130's. Pt states she has started bringing her lunch to work now and stopped eating fast food in the morning. Pt states she has also cut back on her vodka drinks down to 1 drink to 0 every week. Pt states she used to be an emotional eater but now uses alone time as her stress relief mechanism.   24-Hr Dietary Recall First Meal: protein shake  Snack: fruit Second Meal: tuna in pita pocket or salad  Snack: nuts Third Meal: deep dish pizza Snack: popcorn Beverages: soda, water    Estimated Energy Needs Calories: 1500   NUTRITION DIAGNOSIS  Overweight/obesity ([place]-3.3) related to past poor dietary habits and physical inactivity as evidenced by patient w/ planned sleeve surgery following dietary guidelines for continued weight loss.    NUTRITION INTERVENTION  Nutrition  counseling (C-1) and education (E-2) to facilitate bariatric surgery goals.   Pre-Op Goals Reviewed with the Patient . Track food and beverage intake (pen and paper, MyFitness Pal, Baritastic app, etc.) . Make healthy food choices while monitoring portion sizes . Consume 3 meals per day or try to eat every 3-5 hours . Avoid concentrated sugars and fried foods . Keep sugar & fat in the single digits per serving on food labels . Practice CHEWING your food (aim for applesauce consistency) . Practice not drinking 15 minutes before, during, and 30 minutes after each meal and snack . Avoid all carbonated beverages (ex: soda, sparkling beverages)  . Limit caffeinated beverages (ex: coffee, tea, energy drinks) . Avoid all sugar-sweetened beverages (ex: regular soda, sports drinks)  . Avoid alcohol  . Aim for 64-100 ounces of FLUID daily (with at least half of fluid intake being plain water)  . Aim for at least 60-80 grams of PROTEIN daily . Look for a liquid protein source that contains ?15 g protein and ?5 g carbohydrate (ex: shakes, drinks, shots) . Make a list of non-food related activities . Physical activity is an important part of a healthy lifestyle so keep it moving! The goal is to reach 150 minutes of exercise per week, including cardiovascular and weight baring activity.  *Goals that are bolded indicate the pt would like to start working towards these  Handouts Provided Include  . Bariatric Surgery handouts (Nutrition Visits, Pre-Op Goals, Protein Shakes, Vitamins & Minerals)  Learning Style & Readiness for Change Teaching method utilized: Visual & Auditory  Demonstrated degree of understanding via: Teach Back  Barriers to learning/adherence to lifestyle change: none identified  MONITORING & EVALUATION Dietary intake, weekly physical activity, body weight, and pre-op goals reached at next nutrition visit.    Next Steps  Patient is to call [facility] to enroll in Pre-Op Class  (>2 weeks before surgery) and Post-Op Class (2 weeks after surgery) for further nutrition education when surgery date is scheduled.

## 2019-06-01 ENCOUNTER — Other Ambulatory Visit: Payer: Self-pay | Admitting: Adult Health

## 2019-06-01 DIAGNOSIS — E1165 Type 2 diabetes mellitus with hyperglycemia: Secondary | ICD-10-CM

## 2019-07-10 ENCOUNTER — Other Ambulatory Visit: Payer: Self-pay

## 2019-07-10 ENCOUNTER — Encounter: Payer: Self-pay | Admitting: Obstetrics and Gynecology

## 2019-07-10 ENCOUNTER — Ambulatory Visit (INDEPENDENT_AMBULATORY_CARE_PROVIDER_SITE_OTHER): Payer: 59 | Admitting: Obstetrics and Gynecology

## 2019-07-10 VITALS — BP 126/89 | HR 77 | Ht 64.0 in | Wt 295.0 lb

## 2019-07-10 DIAGNOSIS — Z1339 Encounter for screening examination for other mental health and behavioral disorders: Secondary | ICD-10-CM

## 2019-07-10 DIAGNOSIS — Z1331 Encounter for screening for depression: Secondary | ICD-10-CM

## 2019-07-10 DIAGNOSIS — Z01419 Encounter for gynecological examination (general) (routine) without abnormal findings: Secondary | ICD-10-CM | POA: Diagnosis not present

## 2019-07-10 NOTE — Progress Notes (Signed)
Gynecology Annual Exam  PCP: Kendell Bane, NP  Chief Complaint  Patient presents with  . Gynecologic Exam    History of Present Illness:  Rachel Gordon is a 53 y.o. G0 who LMP was Patient's last menstrual period was 05/30/2019.Marland Kitchen This was the first in a while. It lasted for one week.  She presents today for her annual examination.  Her menses are generally absent due to endometrial ablation.   She does have vasomotor sx. She has hot flashes a couple times a week.   She is sexually active. She does not have vaginal dryness.  Last Pap: 1 year  Results were: no abnormalities /neg; HPV DNA negative.  Hx of STDs: none  Last mammogram: 07/05/2018  Results were: normal--routine follow-up in 12 months There is a FH of breast cancer in her maternal grandmother. There is no FH of ovarian cancer. The patient does not do self-breast exams.  Colonoscopy: She is due next year.  DEXA: has not been screened for osteoporosis  Tobacco use: The patient denies current or previous tobacco use. Alcohol use: social drinker Exercise: she has had a little more activity than normal.  She has lost 20 pounds since starting some exercise and other lifestyle changes.  The patient wears seatbelts: yes.     Past Medical History:  Diagnosis Date  . Abnormal uterine bleeding (AUB)   . Anemia   . Chronic kidney disease   . DM (diabetes mellitus) ([place])   . Fibroid   . GERD (gastroesophageal reflux disease)   . Headache    h/o  . Hyperlipidemia   . Hyperlipidemia   . Hypertension   . Metrorrhagia   . Obesity    bmi 54  . PCOS (polycystic ovarian syndrome)   . S/P endometrial ablation   . Vaginal Pap smear, abnormal 2009   pos hvp    Past Surgical History:  Procedure Laterality Date  . COLONOSCOPY    . DILATATION & CURETTAGE/HYSTEROSCOPY WITH MYOSURE N/A 11/28/2014   Procedure: DILATATION & CURETTAGE/HYSTEROSCOPY WITH MYOSURE;  Surgeon: Will Bonnet, MD;  Location: [facility] ORS;   Service: Gynecology;  Laterality: N/A;  . DILATION AND CURETTAGE OF UTERUS    . NOVASURE ABLATION      Prior to Admission medications   Medication Sig Start Date End Date Taking? Authorizing Provider  Blood Glucose Monitoring Suppl (ONE TOUCH ULTRA 2) w/Device KIT 1 each by Does not apply route 2 (two) times daily. 10/04/18  Yes Scarboro, Audie Clear, NP  BYDUREON 2 MG PEN INJECT 2 MG SUBCUTANEOUSLY ONCE A WEEK 09/17/18  Yes [provider]  glucose blood (ONE TOUCH ULTRA TEST) test strip Use as instructed 10/04/18  Yes Scarboro, Audie Clear, NP  Lancets (ONETOUCH ULTRASOFT) lancets Use as instructed 10/04/18  Yes Scarboro, Audie Clear, NP  metFORMIN (GLUMETZA) 500 MG (MOD) 24 hr tablet Take 2 tab in morning and 2 tab at night   Yes [provider]  norethindrone (AYGESTIN) 5 MG tablet TAKE ONE TABLET DAILY 12/14/18  Yes Will Bonnet, MD  omeprazole (PRILOSEC) 40 MG capsule Take 40 mg by mouth daily.    Yes [provider]  RYBELSUS 14 MG TABS TAKE 1 TABLET BY MOUTH EVERY DAY 06/03/19  Yes Lavera Guise, MD  simvastatin (ZOCOR) 40 MG tablet Take 40 mg by mouth every evening.    Yes [provider]  lisinopril-hydrochlorothiazide Reita May) 20-12.5 MG tablet Take by mouth. 06/08/18 06/08/19  [provider]  No Known Allergies  Obstetric History: G0  Family History  Problem Relation Age of Onset  . Colon cancer Mother 62  . Colon cancer Father 41  . Breast cancer Maternal Grandmother 16    Social History   Socioeconomic History  . Marital status: Single    Spouse name: Not on file  . Number of children: Not on file  . Years of education: Not on file  . Highest education level: Not on file  Occupational History  . Not on file  Tobacco Use  . Smoking status: Never Smoker  . Smokeless tobacco: Never Used  Substance and Sexual Activity  . Alcohol use: Yes    Comment: socially  . Drug use: No  . Sexual activity: Yes    Partners:  Male    Birth control/protection: None  Other Topics Concern  . Not on file  Social History Narrative  . Not on file   Social Determinants of Health   Financial Resource Strain:   . Difficulty of Paying Living Expenses: Not on file  Food Insecurity:   . Worried About Charity fundraiser in the Last Year: Not on file  . Ran Out of Food in the Last Year: Not on file  Transportation Needs:   . Lack of Transportation (Medical): Not on file  . Lack of Transportation (Non-Medical): Not on file  Physical Activity:   . Days of Exercise per Week: Not on file  . Minutes of Exercise per Session: Not on file  Stress:   . Feeling of Stress : Not on file  Social Connections:   . Frequency of Communication with Friends and Family: Not on file  . Frequency of Social Gatherings with Friends and Family: Not on file  . Attends Religious Services: Not on file  . Active Member of Clubs or Organizations: Not on file  . Attends Archivist Meetings: Not on file  . Marital Status: Not on file  Intimate Partner Violence:   . Fear of Current or Ex-Partner: Not on file  . Emotionally Abused: Not on file  . Physically Abused: Not on file  . Sexually Abused: Not on file    Review of Systems  Constitutional: Negative.   HENT: Negative.   Eyes: Negative.   Respiratory: Negative.   Cardiovascular: Negative.   Gastrointestinal: Negative.   Genitourinary: Negative.   Musculoskeletal: Negative.   Skin: Negative.   Neurological: Negative.   Psychiatric/Behavioral: Negative.       Physical Exam BP 126/89 (BP Location: Left Arm, Patient Position: Sitting, Cuff Size: Large)   Pulse 77   Ht '5\' 4"'  (1.626 m)   Wt 295 lb (133.8 kg)   LMP 05/30/2019   BMI 50.64 kg/m   Physical Exam Constitutional:      General: She is not in acute distress.    Appearance: Normal appearance. She is well-developed.  Genitourinary:     Pelvic exam was performed with patient supine.     Vulva, urethra,  bladder and uterus normal.     No inguinal adenopathy present in the right or left side.    No signs of injury in the vagina.     No vaginal discharge, erythema, tenderness or bleeding.     No cervical motion tenderness, discharge, lesion or polyp.     Uterus is mobile.     Uterus is not enlarged or tender.     No uterine mass detected.    Uterus is anteverted.  No right or left adnexal mass present.     Right adnexa not tender or full.     Left adnexa not tender or full.  HENT:     Head: Normocephalic and atraumatic.  Eyes:     General: No scleral icterus.    Conjunctiva/sclera: Conjunctivae normal.  Neck:     Thyroid: No thyromegaly.  Cardiovascular:     Rate and Rhythm: Normal rate and regular rhythm.     Heart sounds: No murmur. No friction rub. No gallop.   Pulmonary:     Effort: Pulmonary effort is normal. No respiratory distress.     Breath sounds: Normal breath sounds. No wheezing or rales.  Chest:     Breasts:        Right: No inverted nipple, mass, nipple discharge, skin change or tenderness.        Left: No inverted nipple, mass, nipple discharge, skin change or tenderness.  Abdominal:     General: Bowel sounds are normal. There is no distension.     Palpations: Abdomen is soft. There is no mass.     Tenderness: There is no abdominal tenderness. There is no guarding or rebound.  Musculoskeletal:        General: No swelling or tenderness. Normal range of motion.     Cervical back: Normal range of motion and neck supple.  Lymphadenopathy:     Cervical: No cervical adenopathy.     Lower Body: No right inguinal adenopathy. No left inguinal adenopathy.  Neurological:     General: No focal deficit present.     Mental Status: She is alert and oriented to person, place, and time.     Cranial Nerves: No cranial nerve deficit.  Skin:    General: Skin is warm and dry.     Findings: No erythema or rash.  Psychiatric:        Mood and Affect: Mood normal.         Behavior: Behavior normal.        Judgment: Judgment normal.    Female chaperone present for pelvic and breast  portions of the physical exam  Results: AUDIT Questionnaire (screen for alcoholism): 2 PHQ-9: 1  Assessment: 54 y.o. G0 here for routine gynecologic annual examination  Plan: Problem List Items Addressed This Visit    None    Visit Diagnoses    Women's annual routine gynecological examination    -  Primary   Screening for depression       Screening for alcoholism          Screening: -- Blood pressure screen normal -- Colonoscopy - not due -- Mammogram - due. Patient to schedule through [facility]. She states she will send me a copy. -- Weight screening: obese: discussed management options, including lifestyle, dietary, and exercise. -- Depression screening negative (PHQ-9) -- Nutrition: normal -- cholesterol screening: per PCP -- osteoporosis screening: not due -- tobacco screening: not using -- alcohol screening: AUDIT questionnaire indicates low-risk usage. -- family history of breast cancer screening: done. not at high risk. -- no evidence of domestic violence or intimate partner violence. -- STD screening: gonorrhea/chlamydia NAAT not collected per patient request. -- pap smear not collected per ASCCP guidelines -- flu vaccine received in September -- HPV vaccination series: not eligilbe  Prentice Docker, MD 07/10/2019 9:36 AM

## 2019-07-11 ENCOUNTER — Telehealth: Payer: Self-pay

## 2019-07-11 NOTE — Telephone Encounter (Signed)
Confirmed appointment with patient. klh °

## 2019-07-16 ENCOUNTER — Other Ambulatory Visit: Payer: Self-pay

## 2019-07-16 ENCOUNTER — Ambulatory Visit: Payer: 59 | Admitting: Adult Health

## 2019-07-16 ENCOUNTER — Encounter: Payer: Self-pay | Admitting: Adult Health

## 2019-07-16 VITALS — BP 132/70 | HR 72 | Temp 97.9°F | Resp 16 | Ht 64.0 in | Wt 299.0 lb

## 2019-07-16 DIAGNOSIS — G4733 Obstructive sleep apnea (adult) (pediatric): Secondary | ICD-10-CM

## 2019-07-16 DIAGNOSIS — I10 Essential (primary) hypertension: Secondary | ICD-10-CM

## 2019-07-16 DIAGNOSIS — Z6841 Body Mass Index (BMI) 40.0 and over, adult: Secondary | ICD-10-CM

## 2019-07-16 DIAGNOSIS — E785 Hyperlipidemia, unspecified: Secondary | ICD-10-CM | POA: Diagnosis not present

## 2019-07-16 DIAGNOSIS — E1165 Type 2 diabetes mellitus with hyperglycemia: Secondary | ICD-10-CM

## 2019-07-16 DIAGNOSIS — Z9989 Dependence on other enabling machines and devices: Secondary | ICD-10-CM

## 2019-07-16 DIAGNOSIS — E282 Polycystic ovarian syndrome: Secondary | ICD-10-CM

## 2019-07-16 LAB — POCT GLYCOSYLATED HEMOGLOBIN (HGB A1C): Hemoglobin A1C: 6.4 % — AB (ref 4.0–5.6)

## 2019-07-16 NOTE — Progress Notes (Signed)
[facility] [place], [place] [address]  Internal MEDICINE  Office Visit Note  Patient Name: Rachel Gordon  [id number]  [id number]  Date of Service: 07/16/2019  Chief Complaint  Patient presents with  . Diabetes  . Hypertension  . Hyperlipidemia    HPI Pt is here for follow up on DM, HTN, and HLD.  Her A1C is currently 6.4 which is stable, last a1c is 6.5  She has been doing well denies any issues.  Her bp is controlled at this time.  She Denies Chest pain, Shortness of breath, palpitations, headache, or blurred vision. She is due for labs, however she is going through process to have gastric bypass surgery and is currently have labs drawn in the next day or two.     Current Medication: Outpatient Encounter Medications as of 07/16/2019  Medication Sig  . Blood Glucose Monitoring Suppl (ONE TOUCH ULTRA 2) w/Device KIT 1 each by Does not apply route 2 (two) times daily.  Marland Kitchen glucose blood (ONE TOUCH ULTRA TEST) test strip Use as instructed  . Lancets (ONETOUCH ULTRASOFT) lancets Use as instructed  . metFORMIN (GLUMETZA) 500 MG (MOD) 24 hr tablet Take 2 tab in morning and 2 tab at night  . norethindrone (AYGESTIN) 5 MG tablet TAKE ONE TABLET DAILY  . omeprazole (PRILOSEC) 40 MG capsule Take 40 mg by mouth daily.   . RYBELSUS 14 MG TABS TAKE 1 TABLET BY MOUTH EVERY DAY  . simvastatin (ZOCOR) 40 MG tablet Take 40 mg by mouth every evening.   Marland Kitchen lisinopril-hydrochlorothiazide (PRINZIDE,ZESTORETIC) 20-12.5 MG tablet Take by mouth.  . [DISCONTINUED] BYDUREON 2 MG PEN INJECT 2 MG SUBCUTANEOUSLY ONCE A WEEK   No facility-administered encounter medications on file as of 07/16/2019.    Surgical History: Past Surgical History:  Procedure Laterality Date  . COLONOSCOPY    . DILATATION & CURETTAGE/HYSTEROSCOPY WITH MYOSURE N/A 11/28/2014   Procedure: DILATATION & CURETTAGE/HYSTEROSCOPY WITH MYOSURE;  Surgeon: Will Bonnet, MD;  Location: [facility] ORS;   Service: Gynecology;  Laterality: N/A;  . DILATION AND CURETTAGE OF UTERUS    . NOVASURE ABLATION      Medical History: Past Medical History:  Diagnosis Date  . Abnormal uterine bleeding (AUB)   . Anemia   . Chronic kidney disease   . DM (diabetes mellitus) ([place])   . Fibroid   . GERD (gastroesophageal reflux disease)   . Headache    h/o  . Hyperlipidemia   . Hyperlipidemia   . Hypertension   . Metrorrhagia   . Obesity    bmi 54  . PCOS (polycystic ovarian syndrome)   . S/P endometrial ablation   . Vaginal Pap smear, abnormal 2009   pos hvp    Family History: Family History  Problem Relation Age of Onset  . Colon cancer Mother 1  . Colon cancer Father 31  . Breast cancer Maternal Grandmother 77    Social History   Socioeconomic History  . Marital status: Single    Spouse name: Not on file  . Number of children: Not on file  . Years of education: Not on file  . Highest education level: Not on file  Occupational History  . Not on file  Tobacco Use  . Smoking status: Never Smoker  . Smokeless tobacco: Never Used  Substance and Sexual Activity  . Alcohol use: Yes    Comment: socially  . Drug use: No  . Sexual activity: Yes    Partners: Male  Birth control/protection: None  Other Topics Concern  . Not on file  Social History Narrative  . Not on file   Social Determinants of Health   Financial Resource Strain:   . Difficulty of Paying Living Expenses: Not on file  Food Insecurity:   . Worried About Charity fundraiser in the Last Year: Not on file  . Ran Out of Food in the Last Year: Not on file  Transportation Needs:   . Lack of Transportation (Medical): Not on file  . Lack of Transportation (Non-Medical): Not on file  Physical Activity:   . Days of Exercise per Week: Not on file  . Minutes of Exercise per Session: Not on file  Stress:   . Feeling of Stress : Not on file  Social Connections:   . Frequency of Communication with Friends and  Family: Not on file  . Frequency of Social Gatherings with Friends and Family: Not on file  . Attends Religious Services: Not on file  . Active Member of Clubs or Organizations: Not on file  . Attends Archivist Meetings: Not on file  . Marital Status: Not on file  Intimate Partner Violence:   . Fear of Current or Ex-Partner: Not on file  . Emotionally Abused: Not on file  . Physically Abused: Not on file  . Sexually Abused: Not on file      Review of Systems  Constitutional: Negative for chills, fatigue and unexpected weight change.  HENT: Negative for congestion, rhinorrhea, sneezing and sore throat.   Eyes: Negative for photophobia, pain and redness.  Respiratory: Negative for cough, chest tightness and shortness of breath.   Cardiovascular: Negative for chest pain and palpitations.  Gastrointestinal: Negative for abdominal pain, constipation, diarrhea, nausea and vomiting.  Endocrine: Negative.   Genitourinary: Negative for dysuria and frequency.  Musculoskeletal: Negative for arthralgias, back pain, joint swelling and neck pain.  Skin: Negative for rash.  Allergic/Immunologic: Negative.   Neurological: Negative for tremors and numbness.  Hematological: Negative for adenopathy. Does not bruise/bleed easily.  Psychiatric/Behavioral: Negative for behavioral problems and sleep disturbance. The patient is not nervous/anxious.     Vital Signs: BP 132/70   Pulse 72   Temp 97.9 F (36.6 C)   Resp 16   Ht _0  (1.626 m)   Wt 299 lb (135.6 kg)   SpO2 100%   BMI 51.32 kg/m    Physical Exam Vitals and nursing note reviewed.  Constitutional:      General: She is not in acute distress.    Appearance: She is well-developed. She is not diaphoretic.  HENT:     Head: Normocephalic and atraumatic.     Mouth/Throat:     Pharynx: No oropharyngeal exudate.  Eyes:     Pupils: Pupils are equal, round, and reactive to light.  Neck:     Thyroid: No thyromegaly.      Vascular: No JVD.     Trachea: No tracheal deviation.  Cardiovascular:     Rate and Rhythm: Normal rate and regular rhythm.     Heart sounds: Normal heart sounds. No murmur. No friction rub. No gallop.   Pulmonary:     Effort: Pulmonary effort is normal. No respiratory distress.     Breath sounds: Normal breath sounds. No wheezing or rales.  Chest:     Chest wall: No tenderness.  Abdominal:     Palpations: Abdomen is soft.     Tenderness: There is no abdominal tenderness. There is no  guarding.  Musculoskeletal:        General: Normal range of motion.     Cervical back: Normal range of motion and neck supple.  Lymphadenopathy:     Cervical: No cervical adenopathy.  Skin:    General: Skin is warm and dry.  Neurological:     Mental Status: She is alert and oriented to person, place, and time.     Cranial Nerves: No cranial nerve deficit.  Psychiatric:        Behavior: Behavior normal.        Thought Content: Thought content normal.        Judgment: Judgment normal.    Assessment/Plan: 1. Uncontrolled type 2 diabetes mellitus with hyperglycemia (HCC) A1C improved, continue current medications and therapy.  Once again encouraged exercise and diet.  She is doing well and continues to lose weight.    - POCT HgB A1C - Microalbumin, urine  2. Hypertension, unspecified type Stable, continue present management.   3. Hyperlipidemia, unspecified hyperlipidemia type Pt is having lipid panel drawn for bariatric surgery.   4. OSA on CPAP Compliant, continue therapy as prescribed.   5. PCOS (polycystic ovarian syndrome) Managed by GYN, continue to follow.   6. Class 3 severe obesity due to excess calories with serious comorbidity and body mass index (BMI) of 50.0 to 59.9 in adult [facility]) Obesity Counseling: Risk Assessment: An assessment of behavioral risk factors was made today and includes lack of exercise sedentary lifestyle, lack of portion control and poor dietary habits.  Risk  Modification Advice: She was counseled on portion control guidelines. Restricting daily caloric intake to 1800. The detrimental long term effects of obesity on her health and ongoing poor compliance was also discussed with the patient.    General Counseling: Blakley verbalizes understanding of the findings of todays visit and agrees with plan of treatment. I have discussed any further diagnostic evaluation that may be needed or ordered today. We also reviewed her medications today. she has been encouraged to call the office with any questions or concerns that should arise related to todays visit.    Orders Placed This Encounter  Procedures  . Microalbumin, urine  . POCT HgB A1C    No orders of the defined types were placed in this encounter.   Time spent: 25 Minutes   This patient was seen by Orson Gear AGNP-C in Collaboration with Dr Lavera Guise as a part of collaborative care agreement     Kendell Bane AGNP-C Internal medicine

## 2019-07-17 LAB — MICROALBUMIN, URINE: Microalbumin, Urine: 106.6 ug/mL

## 2019-08-08 ENCOUNTER — Other Ambulatory Visit: Payer: Self-pay | Admitting: Adult Health

## 2019-08-10 ENCOUNTER — Other Ambulatory Visit: Payer: Self-pay | Admitting: Adult Health

## 2019-08-10 MED ORDER — VITAMIN D (ERGOCALCIFEROL) 1.25 MG (50000 UNIT) PO CAPS: 50000.0000 [IU] | ORAL_CAPSULE | ORAL | 0 refills | Status: DC

## 2019-08-10 NOTE — Progress Notes (Signed)
Pt had vit d level drawn for bariatric surgery surveillance.  Her level was 15.8, sent Vit D supplement to pharmacy

## 2019-09-27 ENCOUNTER — Ambulatory Visit: Payer: 59

## 2019-09-30 ENCOUNTER — Other Ambulatory Visit: Payer: Self-pay | Admitting: Adult Health

## 2019-09-30 MED ORDER — LISINOPRIL-HYDROCHLOROTHIAZIDE 20-12.5 MG PO TABS: 1.0000 | ORAL_TABLET | Freq: Every day | ORAL | 1 refills | Status: DC

## 2019-09-30 NOTE — Progress Notes (Signed)
Refilled Zestoretic for patient

## 2019-10-01 ENCOUNTER — Other Ambulatory Visit: Payer: Self-pay | Admitting: Internal Medicine

## 2019-10-01 DIAGNOSIS — E1165 Type 2 diabetes mellitus with hyperglycemia: Secondary | ICD-10-CM

## 2019-10-04 ENCOUNTER — Other Ambulatory Visit: Payer: Self-pay

## 2019-10-04 ENCOUNTER — Encounter: Payer: 59 | Attending: Surgery | Admitting: Dietician

## 2019-10-04 VITALS — Ht 64.0 in | Wt 300.4 lb

## 2019-10-04 DIAGNOSIS — E119 Type 2 diabetes mellitus without complications: Secondary | ICD-10-CM | POA: Insufficient documentation

## 2019-10-04 DIAGNOSIS — Z79899 Other long term (current) drug therapy: Secondary | ICD-10-CM | POA: Diagnosis not present

## 2019-10-04 DIAGNOSIS — K219 Gastro-esophageal reflux disease without esophagitis: Secondary | ICD-10-CM | POA: Diagnosis not present

## 2019-10-04 DIAGNOSIS — G4730 Sleep apnea, unspecified: Secondary | ICD-10-CM | POA: Diagnosis not present

## 2019-10-04 DIAGNOSIS — E7800 Pure hypercholesterolemia, unspecified: Secondary | ICD-10-CM | POA: Insufficient documentation

## 2019-10-04 DIAGNOSIS — Z800 Family history of malignant neoplasm of digestive organs: Secondary | ICD-10-CM | POA: Insufficient documentation

## 2019-10-04 DIAGNOSIS — Z8249 Family history of ischemic heart disease and other diseases of the circulatory system: Secondary | ICD-10-CM | POA: Insufficient documentation

## 2019-10-04 DIAGNOSIS — Z6841 Body Mass Index (BMI) 40.0 and over, adult: Secondary | ICD-10-CM | POA: Insufficient documentation

## 2019-10-04 DIAGNOSIS — Z713 Dietary counseling and surveillance: Secondary | ICD-10-CM | POA: Insufficient documentation

## 2019-10-04 DIAGNOSIS — I10 Essential (primary) hypertension: Secondary | ICD-10-CM | POA: Insufficient documentation

## 2019-10-04 DIAGNOSIS — Z7984 Long term (current) use of oral hypoglycemic drugs: Secondary | ICD-10-CM | POA: Diagnosis not present

## 2019-10-04 NOTE — Progress Notes (Signed)
Pre-Operative Nutrition Class:  Appt start time: 0900   End time:  1100.  Patient was seen on 10/04/19 for Pre-Operative Bariatric Surgery Education at Nutrition and Diabetes Education Services at [facility].   Surgery date: TBD Surgery type: Sleeve Gastrectomy [facility] 297lbs Weight today: 300.4lbs  InBody  BODY COMP RESULTS    BMI (kg/m^2) 51.5  Fat Mass (lbs) 155.1  Dry Lean Mass (lbs) 37.7  Total Body Water (lbs) 107.6   Samples given per MNT protocol. Patient educated on appropriate usage: Celebrate Vitamins Multivitamin  Lot # [id number] ,  Exp: 09/2020;  Lot# [id number], Exp: 10/2020; [id number]  Exp: 05/2020; Lot# [id number], Exp: 05/2020  Celebrate Vitamins Calcium Citrate   Lot # [id number], Exp: 03/2020; Lot# [id number], Exp: 12/2019; Lot#: [id number], Exp: 01/2020; Lot# [id number], Exp: 02/2020; Lot# [id number], Exp: 01/2020; Lot# [id number], Exp: 01/2020; Lot# [id number], Exp: 05/2020  Renee Pain Protein Powder   Lot # [id number], Exp: 12/2019; Lot#: [id number], Exp: 12/2019; Lot#: [id number], Exp: 12/2019  Premier Protein Shake   Lot# [id number], Exp: 03/15/20  The following the learning objectives were met by the patient during this course:  Identify Pre-Op Dietary Goals and will begin 2 weeks pre-operatively  Identify appropriate sources of fluids and proteins   State protein recommendations and appropriate sources pre and post-operatively  Identify Post-Operative Dietary Goals and will follow for 2 weeks post-operatively  Identify appropriate multivitamin and calcium sources  Describe the need for physical activity post-operatively and will follow MD recommendations  State when to call healthcare provider regarding medication questions or post-operative complications  Handouts given during class include:  Pre-Op Bariatric Surgery Diet Handout  Protein Shake Handout  Post-Op Bariatric Surgery Nutrition Handout  [facility] Program Information Flyer  Support Group Information Flyer  [facility] Bariatric Supplements  Price List  Follow-Up Plan: Patient will follow-up at [place], at about 2 weeks post operatively for diet advancement per MD.

## 2019-10-13 ENCOUNTER — Other Ambulatory Visit: Payer: Self-pay | Admitting: Adult Health

## 2019-10-15 ENCOUNTER — Telehealth: Payer: Self-pay

## 2019-10-15 NOTE — Telephone Encounter (Signed)
Confirmed and screened for 10-17-19 ov.

## 2019-10-15 NOTE — Telephone Encounter (Signed)
Called lmom informing patient on appointment on 10/17/2019. klh

## 2019-10-16 NOTE — Patient Instructions (Addendum)
DUE TO COVID-19 ONLY ONE VISITOR IS ALLOWED TO COME WITH YOU AND STAY IN THE WAITING ROOM ONLY DURING PRE OP AND PROCEDURE DAY OF SURGERY. THE 1 VISITOR MAY VISIT WITH YOU AFTER SURGERY IN YOUR PRIVATE ROOM DURING VISITING HOURS ONLY!  YOU NEED TO HAVE A COVID 19 TEST ON 10-25-19 @ 3:30 PM, THIS TEST MUST BE DONE BEFORE SURGERY, COME  [place], [address] , [address].  ([place]) ONCE YOUR COVID TEST IS COMPLETED, PLEASE BEGIN THE QUARANTINE INSTRUCTIONS AS OUTLINED IN YOUR HANDOUT.                Ameenah Singelton Huck  10/16/2019   Your procedure is scheduled on: 10-29-19    Report to [facility] Main  Entrance    Report to admitting at 11:00 AM     Call this number if you have problems the morning of surgery [phone]    Remember: After Midnight you may have a Clear Liquid diet until 7:00 AM. After 7:00 AM, nothing until after surgery.     CLEAR LIQUID DIET   Foods Allowed                                                                     Foods Excluded  Coffee and tea, regular and decaf                             liquids that you cannot  Plain Jell-O any favor except red or purple                                           see through such as: Fruit ices (not with fruit pulp)                                     milk, soups, orange juice  Iced Popsicles                                    All solid food Carbonated beverages, diet                                    Cranberry, grape and apple juices Sports drinks like Gatorade Lightly seasoned clear broth or consume(fat free) Sugar, honey syrup   _____________________________________________________________________  Take these medicines the morning of surgery with A SIP OF WATER: Omeprazole (Prilosec)  BRUSH YOUR TEETH MORNING OF SURGERY AND RINSE YOUR MOUTH OUT, NO CHEWING GUM CANDY OR MINTS.   DO NOT TAKE ANY DIABETIC MEDICATIONS DAY OF YOUR SURGERY                               You may  not have any metal on your body including hair pins and  piercings      Do not wear jewelry, make-up, lotions, powders or perfumes, deodorant              Do not wear nail polish on your fingernails.  Do not shave  48 hours prior to surgery.         Do not bring valuables to the hospital. [place].  Contacts, dentures or bridgework may not be worn into surgery.  You may bring an overnight bag    Special Instructions: N/A              Please read over the following fact sheets you were given: _____________________________________________________________________  How to Manage Your Diabetes Before and After Surgery  Why is it important to control my blood sugar before and after surgery? . Improving blood sugar levels before and after surgery helps healing and can limit problems. . A way of improving blood sugar control is eating a healthy diet by: o  Eating less sugar and carbohydrates o  Increasing activity/exercise o  Talking with your doctor about reaching your blood sugar goals . High blood sugars (greater than 180 mg/dL) can raise your risk of infections and slow your recovery, so you will need to focus on controlling your diabetes during the weeks before surgery. . Make sure that the doctor who takes care of your diabetes knows about your planned surgery including the date and location.  How do I manage my blood sugar before surgery? . Check your blood sugar at least 4 times a day, starting 2 days before surgery, to make sure that the level is not too high or low. o Check your blood sugar the morning of your surgery when you wake up and every 2 hours until you get to the Short Stay unit. . If your blood sugar is less than 70 mg/dL, you will need to treat for low blood sugar: o Do not take insulin. o Treat a low blood sugar (less than 70 mg/dL) with  cup of clear juice (cranberry or apple), 4 glucose tablets, OR  glucose gel. o Recheck blood sugar in 15 minutes after treatment (to make sure it is greater than 70 mg/dL). If your blood sugar is not greater than 70 mg/dL on recheck, call [phone] for further instructions. . Report your blood sugar to the short stay nurse when you get to Short Stay.  . If you are admitted to the hospital after surgery: o Your blood sugar will be checked by the staff and you will probably be given insulin after surgery (instead of oral diabetes medicines) to make sure you have good blood sugar levels. o The goal for blood sugar control after surgery is 80-180 mg/dL.   WHAT DO I DO ABOUT MY DIABETES MEDICATION?  Marland Kitchen Do not take oral diabetes medicines (pills) the morning of surgery.  . THE DAY BEFORE SURGERY, take your usual Metformin and Rybelsus      eviewed and Endorsed by [facility] Patient Education Committee, August 2015           [facility] - Preparing for Surgery Before surgery, you can play an important role.  Because skin is not sterile, your skin needs to be as free of germs as possible.  You can reduce the number of germs on your skin by washing with CHG (chlorahexidine gluconate) soap before surgery.  CHG is an antiseptic  cleaner which kills germs and bonds with the skin to continue killing germs even after washing. Please DO NOT use if you have an allergy to CHG or antibacterial soaps.  If your skin becomes reddened/irritated stop using the CHG and inform your nurse when you arrive at Short Stay. Do not shave (including legs and underarms) for at least 48 hours prior to the first CHG shower.  You may shave your face/neck. Please follow these instructions carefully:  1.  Shower with CHG Soap the night before surgery and the  morning of Surgery.  2.  If you choose to wash your hair, wash your hair first as usual with your  normal  shampoo.  3.  After you shampoo, rinse your hair and body thoroughly to remove the  shampoo.                           4.  Use  CHG as you would any other liquid soap.  You can apply chg directly  to the skin and wash                       Gently with a scrungie or clean washcloth.  5.  Apply the CHG Soap to your body ONLY FROM THE NECK DOWN.   Do not use on face/ open                           Wound or open sores. Avoid contact with eyes, ears mouth and genitals (private parts).                       Wash face,  Genitals (private parts) with your normal soap.             6.  Wash thoroughly, paying special attention to the area where your surgery  will be performed.  7.  Thoroughly rinse your body with warm water from the neck down.  8.  DO NOT shower/wash with your normal soap after using and rinsing off  the CHG Soap.                9.  Pat yourself dry with a clean towel.            10.  Wear clean pajamas.            11.  Place clean sheets on your bed the night of your first shower and do not  sleep with pets. Day of Surgery : Do not apply any lotions/deodorants the morning of surgery.  Please wear clean clothes to the hospital/surgery center.  FAILURE TO FOLLOW THESE INSTRUCTIONS MAY RESULT IN THE CANCELLATION OF YOUR SURGERY PATIENT SIGNATURE_________________________________  NURSE SIGNATURE__________________________________  ________________________________________________________________________  PAIN IS EXPECTED AFTER SURGERY AND WILL NOT BE COMPLETELY ELIMINATED. AMBULATION AND TYLENOL WILL HELP REDUCE INCISIONAL AND GAS PAIN. MOVEMENT IS KEY!  YOU ARE EXPECTED TO BE OUT OF BED WITHIN 4 HOURS OF ADMISSION TO YOUR PATIENT ROOM.  SITTING IN THE RECLINER THROUGHOUT THE DAY IS IMPORTANT FOR DRINKING FLUIDS AND MOVING GAS THROUGHOUT THE GI TRACT.  COMPRESSION STOCKINGS SHOULD BE WORN [place] UNLESS YOU ARE WALKING.   INCENTIVE SPIROMETER SHOULD BE USED EVERY HOUR WHILE AWAKE TO DECREASE POST-OPERATIVE COMPLICATIONS SUCH AS PNEUMONIA.  WHEN DISCHARGED HOME, IT IS IMPORTANT TO  CONTINUE TO WALK EVERY HOUR AND USE THE INCENTIVE SPIROMETER  EVERY HOUR.

## 2019-10-16 NOTE — Progress Notes (Signed)
Please place orders. Pt is scheduled for her PAT appointment tomorrow.

## 2019-10-16 NOTE — Progress Notes (Signed)
PCP - Kendell Bane, NP Cardiologist -   Chest x-ray - 05-23-19  EKG - 05-23-19  Stress Test -  ECHO -  Cardiac Cath -   Sleep Study -  CPAP -   Fasting Blood Sugar -  Checks Blood Sugar _____ times a day  Blood Thinner Instructions: Aspirin Instructions: Last Dose:  Anesthesia review:   Patient denies shortness of breath, fever, cough and chest pain at PAT appointment   Patient verbalized understanding of instructions that were given to them at the PAT appointment. Patient was also instructed that they will need to review over the PAT instructions again at home before surgery.

## 2019-10-17 ENCOUNTER — Encounter (HOSPITAL_COMMUNITY)
Admission: RE | Admit: 2019-10-17 | Discharge: 2019-10-17 | Disposition: A | Discharge status: Home / Self Care | Payer: 59 | Source: Ambulatory Visit | Attending: Surgery | Admitting: Surgery

## 2019-10-17 ENCOUNTER — Other Ambulatory Visit: Payer: Self-pay

## 2019-10-17 ENCOUNTER — Encounter (HOSPITAL_COMMUNITY): Payer: Self-pay

## 2019-10-17 ENCOUNTER — Encounter: Payer: Self-pay | Admitting: Adult Health

## 2019-10-17 ENCOUNTER — Ambulatory Visit: Payer: 59 | Admitting: Adult Health

## 2019-10-17 VITALS — BP 115/64 | HR 79 | Temp 97.3°F | Resp 16 | Ht 64.0 in | Wt 292.8 lb

## 2019-10-17 DIAGNOSIS — G4733 Obstructive sleep apnea (adult) (pediatric): Secondary | ICD-10-CM | POA: Diagnosis not present

## 2019-10-17 DIAGNOSIS — E1165 Type 2 diabetes mellitus with hyperglycemia: Secondary | ICD-10-CM

## 2019-10-17 DIAGNOSIS — Z01812 Encounter for preprocedural laboratory examination: Secondary | ICD-10-CM | POA: Diagnosis present

## 2019-10-17 DIAGNOSIS — E785 Hyperlipidemia, unspecified: Secondary | ICD-10-CM

## 2019-10-17 DIAGNOSIS — I10 Essential (primary) hypertension: Secondary | ICD-10-CM | POA: Diagnosis not present

## 2019-10-17 DIAGNOSIS — Z9989 Dependence on other enabling machines and devices: Secondary | ICD-10-CM

## 2019-10-17 DIAGNOSIS — E282 Polycystic ovarian syndrome: Secondary | ICD-10-CM

## 2019-10-17 DIAGNOSIS — Z6841 Body Mass Index (BMI) 40.0 and over, adult: Secondary | ICD-10-CM

## 2019-10-17 LAB — BASIC METABOLIC PANEL
Anion gap: 12 (ref 5–15)
BUN: 24 mg/dL — ABNORMAL HIGH (ref 6–20)
CO2: 26 mmol/L (ref 22–32)
Calcium: 11 mg/dL — ABNORMAL HIGH (ref 8.9–10.3)
Chloride: 103 mmol/L (ref 98–111)
Creatinine, Ser: 0.91 mg/dL (ref 0.44–1.00)
GFR calc Af Amer: >60 mL/min (ref >60)
GFR calc non Af Amer: >60 mL/min (ref >60)
Glucose, Bld: 146 mg/dL — ABNORMAL HIGH (ref 70–99)
Potassium: 4.9 mmol/L (ref 3.5–5.1)
Sodium: 141 mmol/L (ref 135–145)

## 2019-10-17 LAB — CBC
HCT: 44.1 % (ref 36.0–46.0)
Hemoglobin: 14.5 g/dL (ref 12.0–15.0)
MCH: 29.2 pg (ref 26.0–34.0)
MCHC: 32.9 g/dL (ref 30.0–36.0)
MCV: 88.9 fL (ref 80.0–100.0)
Platelets: 320 10*3/uL (ref 150–400)
RBC: 4.96 MIL/uL (ref 3.87–5.11)
RDW: 12.1 % (ref 11.5–15.5)
WBC: 8.3 10*3/uL (ref 4.0–10.5)
nRBC: 0 % (ref 0.0–0.2)

## 2019-10-17 LAB — POCT GLYCOSYLATED HEMOGLOBIN (HGB A1C): Hemoglobin A1C: 6.5 % — AB (ref 4.0–5.6)

## 2019-10-17 NOTE — Progress Notes (Signed)
[facility] [place], [place] [address]  Internal MEDICINE  Office Visit Note  Patient Name: Rachel Gordon  [id number]  [id number]  Date of Service: 10/17/2019  Chief Complaint  Patient presents with  . Hyperlipidemia  . Hypertension  . Diabetes  . Quality Metric Gaps    foot exam     HPI  Pt is here today for follow up on HTN, HLD, and DM.  Her bp and HLD are controlled with medications.  Her A1C today is 6.5.  She continues with her current medications.  She is scheduled for Gastric sleeve surgery on april12th, she is currently on the limited diet.  Denies any pain or issues.    Current Medication: Outpatient Encounter Medications as of 10/17/2019  Medication Sig  . Blood Glucose Monitoring Suppl (ONE TOUCH ULTRA 2) w/Device KIT 1 each by Does not apply route 2 (two) times daily.  Marland Kitchen CALCIUM PO Take 1 tablet by mouth 3 (three) times daily.  Marland Kitchen glucose blood (ONE TOUCH ULTRA TEST) test strip Use as instructed  . Lancets (ONETOUCH ULTRASOFT) lancets Use as instructed  . lisinopril-hydrochlorothiazide (ZESTORETIC) 20-12.5 MG tablet Take 1 tablet by mouth daily.  . metFORMIN (GLUCOPHAGE-XR) 500 MG 24 hr tablet Take 1,000 mg by mouth in the morning and at bedtime.  . naproxen sodium (ALEVE) 220 MG tablet Take 440 mg by mouth daily as needed (pain).  . norethindrone (AYGESTIN) 5 MG tablet TAKE ONE TABLET DAILY (Patient taking differently: Take 5 mg by mouth daily. )  . omeprazole (PRILOSEC) 40 MG capsule Take 40 mg by mouth daily.   . RYBELSUS 14 MG TABS TAKE 1 TABLET BY MOUTH EVERY DAY (Patient taking differently: Take 14 mg by mouth daily. )  . simvastatin (ZOCOR) 40 MG tablet Take 40 mg by mouth at bedtime.   . Vitamin D, Ergocalciferol, (DRISDOL) 1.25 MG (50000 UNIT) CAPS capsule TAKE 1 CAPSULE (50,000 UNITS TOTAL) BY MOUTH EVERY 7 (SEVEN) DAYS. (Patient taking differently: Take 50,000 Units by mouth every Sunday. )   No facility-administered  encounter medications on file as of 10/17/2019.    Surgical History: Past Surgical History:  Procedure Laterality Date  . COLONOSCOPY    . DILATATION & CURETTAGE/HYSTEROSCOPY WITH MYOSURE N/A 11/28/2014   Procedure: DILATATION & CURETTAGE/HYSTEROSCOPY WITH MYOSURE;  Surgeon: Will Bonnet, MD;  Location: [facility] ORS;  Service: Gynecology;  Laterality: N/A;  . DILATION AND CURETTAGE OF UTERUS    . NOVASURE ABLATION      Medical History: Past Medical History:  Diagnosis Date  . Abnormal uterine bleeding (AUB)   . Anemia   . Chronic kidney disease   . DM (diabetes mellitus) ([place])   . Fibroid   . GERD (gastroesophageal reflux disease)   . Headache    h/o  . Hyperlipidemia   . Hyperlipidemia   . Hypertension   . Metrorrhagia   . Obesity    bmi 54  . PCOS (polycystic ovarian syndrome)   . S/P endometrial ablation   . Vaginal Pap smear, abnormal 2009   pos hvp    Family History: Family History  Problem Relation Age of Onset  . Colon cancer Mother 77  . Colon cancer Father 60  . Breast cancer Maternal Grandmother 59    Social History   Socioeconomic History  . Marital status: Single    Spouse name: Not on file  . Number of children: Not on file  . Years of education: Not on file  .  Highest education level: Not on file  Occupational History  . Not on file  Tobacco Use  . Smoking status: Never Smoker  . Smokeless tobacco: Never Used  Substance and Sexual Activity  . Alcohol use: Yes    Comment: socially  . Drug use: No  . Sexual activity: Yes    Partners: Male    Birth control/protection: None  Other Topics Concern  . Not on file  Social History Narrative  . Not on file   Social Determinants of Health   Financial Resource Strain:   . Difficulty of Paying Living Expenses:   Food Insecurity:   . Worried About Charity fundraiser in the Last Year:   . Arboriculturist in the Last Year:   Transportation Needs:   . Film/video editor (Medical):   Marland Kitchen Lack  of Transportation (Non-Medical):   Physical Activity:   . Days of Exercise per Week:   . Minutes of Exercise per Session:   Stress:   . Feeling of Stress :   Social Connections:   . Frequency of Communication with Friends and Family:   . Frequency of Social Gatherings with Friends and Family:   . Attends Religious Services:   . Active Member of Clubs or Organizations:   . Attends Archivist Meetings:   Marland Kitchen Marital Status:   Intimate Partner Violence:   . Fear of Current or Ex-Partner:   . Emotionally Abused:   Marland Kitchen Physically Abused:   . Sexually Abused:       Review of Systems  Constitutional: Negative for chills, fatigue and unexpected weight change.  HENT: Negative for congestion, rhinorrhea, sneezing and sore throat.   Eyes: Negative for photophobia, pain and redness.  Respiratory: Negative for cough, chest tightness and shortness of breath.   Cardiovascular: Negative for chest pain and palpitations.  Gastrointestinal: Negative for abdominal pain, constipation, diarrhea, nausea and vomiting.  Endocrine: Negative.   Genitourinary: Negative for dysuria and frequency.  Musculoskeletal: Negative for arthralgias, back pain, joint swelling and neck pain.  Skin: Negative for rash.  Allergic/Immunologic: Negative.   Neurological: Negative for tremors and numbness.  Hematological: Negative for adenopathy. Does not bruise/bleed easily.  Psychiatric/Behavioral: Negative for behavioral problems and sleep disturbance. The patient is not nervous/anxious.     Vital Signs: BP 115/64   Pulse 79   Temp (!) 97.3 F (36.3 C)   Resp 16   Ht '5\' 4"'  (1.626 m)   Wt 292 lb 12.8 oz (132.8 kg)   SpO2 99%   BMI 50.26 kg/m    Physical Exam Vitals and nursing note reviewed.  Constitutional:      General: She is not in acute distress.    Appearance: She is well-developed. She is not diaphoretic.  HENT:     Head: Normocephalic and atraumatic.     Mouth/Throat:     Pharynx: No  oropharyngeal exudate.  Eyes:     Pupils: Pupils are equal, round, and reactive to light.  Neck:     Thyroid: No thyromegaly.     Vascular: No JVD.     Trachea: No tracheal deviation.  Cardiovascular:     Rate and Rhythm: Normal rate and regular rhythm.     Heart sounds: Normal heart sounds. No murmur. No friction rub. No gallop.   Pulmonary:     Effort: Pulmonary effort is normal. No respiratory distress.     Breath sounds: Normal breath sounds. No wheezing or rales.  Chest:  Chest wall: No tenderness.  Abdominal:     Palpations: Abdomen is soft.     Tenderness: There is no abdominal tenderness. There is no guarding.  Musculoskeletal:        General: Normal range of motion.     Cervical back: Normal range of motion and neck supple.  Lymphadenopathy:     Cervical: No cervical adenopathy.  Skin:    General: Skin is warm and dry.  Neurological:     Mental Status: She is alert and oriented to person, place, and time.     Cranial Nerves: No cranial nerve deficit.  Psychiatric:        Behavior: Behavior normal.        Thought Content: Thought content normal.        Judgment: Judgment normal.    Assessment/Plan: 1. Uncontrolled type 2 diabetes mellitus with hyperglycemia (HCC) A1C is 6.5 today.  Continue current medications.  - POCT HgB A1C  2. Hypertension, unspecified type Stable, continue current medications.   3. Hyperlipidemia, unspecified hyperlipidemia type Continue stain.   4. OSA on CPAP Continue to use cpap, good relief of symptoms at this time.   5. PCOS (polycystic ovarian syndrome) Continue metformin  6. Morbid obesity (HCC) Obesity Counseling: Risk Assessment: An assessment of behavioral risk factors was made today and includes lack of exercise sedentary lifestyle, lack of portion control and poor dietary habits.  Risk Modification Advice: She was counseled on portion control guidelines. Restricting daily caloric intake to 1800. The detrimental long  term effects of obesity on her health and ongoing poor compliance was also discussed with the patient.  7. BMI 50.0-59.9, adult [facility]) PT is scheduled for Bariatric surgery in 2 weeks.   General Counseling: Pierina verbalizes understanding of the findings of todays visit and agrees with plan of treatment. I have discussed any further diagnostic evaluation that may be needed or ordered today. We also reviewed her medications today. she has been encouraged to call the office with any questions or concerns that should arise related to todays visit.    Orders Placed This Encounter  Procedures  . POCT HgB A1C    No orders of the defined types were placed in this encounter.   Time spent: 30 Minutes   This patient was seen by Orson Gear AGNP-C in Collaboration with Dr Lavera Guise as a part of collaborative care agreement     Kendell Bane AGNP-C Internal medicine

## 2019-10-21 NOTE — Progress Notes (Signed)
BMP results routed to Dr. Hassell Done for review.

## 2019-10-25 ENCOUNTER — Other Ambulatory Visit (HOSPITAL_COMMUNITY)
Admission: RE | Admit: 2019-10-25 | Discharge: 2019-10-25 | Disposition: A | Discharge status: Home / Self Care | Payer: 59 | Source: Ambulatory Visit | Attending: Surgery | Admitting: Surgery

## 2019-10-25 DIAGNOSIS — Z01812 Encounter for preprocedural laboratory examination: Secondary | ICD-10-CM | POA: Diagnosis present

## 2019-10-25 DIAGNOSIS — Z20822 Contact with and (suspected) exposure to covid-19: Secondary | ICD-10-CM | POA: Insufficient documentation

## 2019-10-26 LAB — SARS CORONAVIRUS 2 (TAT 6-24 HRS): SARS Coronavirus 2: NEGATIVE

## 2019-10-28 NOTE — H&P (Signed)
Chief Complaint:  Morbid obesity with BMI 51  History of Present Illness:  Rachel Gordon is an 54 y.o. female who has a BMI above 50 and seeks sleeve gastrectomy.  She has been seen in the office and has a good knowledge of the procedure  Past Medical History:  Diagnosis Date  . Abnormal uterine bleeding (AUB)   . Anemia   . Chronic kidney disease   . DM (diabetes mellitus) ([place])   . Fibroid   . GERD (gastroesophageal reflux disease)   . Headache    h/o  . Hyperlipidemia   . Hyperlipidemia   . Hypertension   . Metrorrhagia   . Obesity    bmi 54  . PCOS (polycystic ovarian syndrome)   . S/P endometrial ablation   . Vaginal Pap smear, abnormal 2009   pos hvp    Past Surgical History:  Procedure Laterality Date  . COLONOSCOPY    . DILATATION & CURETTAGE/HYSTEROSCOPY WITH MYOSURE N/A 11/28/2014   Procedure: DILATATION & CURETTAGE/HYSTEROSCOPY WITH MYOSURE;  Surgeon: Will Bonnet, MD;  Location: [facility] ORS;  Service: Gynecology;  Laterality: N/A;  . DILATION AND CURETTAGE OF UTERUS    . NOVASURE ABLATION    . TONSILLECTOMY      No current facility-administered medications for this encounter.   Current Outpatient Medications  Medication Sig Dispense Refill  . CALCIUM PO Take 1 tablet by mouth 3 (three) times daily.    Marland Kitchen lisinopril-hydrochlorothiazide (ZESTORETIC) 20-12.5 MG tablet Take 1 tablet by mouth daily. 90 tablet 1  . metFORMIN (GLUCOPHAGE-XR) 500 MG 24 hr tablet Take 1,000 mg by mouth in the morning and at bedtime.    . naproxen sodium (ALEVE) 220 MG tablet Take 440 mg by mouth daily as needed (pain).    . norethindrone (AYGESTIN) 5 MG tablet TAKE ONE TABLET DAILY (Patient taking differently: Take 5 mg by mouth daily. ) 90 tablet 3  . RYBELSUS 14 MG TABS TAKE 1 TABLET BY MOUTH EVERY DAY (Patient taking differently: Take 14 mg by mouth daily. ) 30 tablet 3  . simvastatin (ZOCOR) 40 MG tablet Take 40 mg by mouth at bedtime.     . Vitamin D, Ergocalciferol,  (DRISDOL) 1.25 MG (50000 UNIT) CAPS capsule TAKE 1 CAPSULE (50,000 UNITS TOTAL) BY MOUTH EVERY 7 (SEVEN) DAYS. (Patient taking differently: Take 50,000 Units by mouth every Sunday. ) 12 capsule 0  . Blood Glucose Monitoring Suppl (ONE TOUCH ULTRA 2) w/Device KIT 1 each by Does not apply route 2 (two) times daily. 1 each 0  . glucose blood (ONE TOUCH ULTRA TEST) test strip Use as instructed 100 each 12  . Lancets (ONETOUCH ULTRASOFT) lancets Use as instructed 100 each 12  . omeprazole (PRILOSEC) 40 MG capsule Take 40 mg by mouth daily.      Patient has no known allergies. Family History  Problem Relation Age of Onset  . Colon cancer Mother 48  . Colon cancer Father 11  . Breast cancer Maternal Grandmother 60   Social History:   reports that she has never smoked. She has never used smokeless tobacco. She reports current alcohol use. She reports that she does not use drugs.   REVIEW OF SYSTEMS : Negative except for see problem list  Physical Exam:   There were no vitals taken for this visit. There is no height or weight on file to calculate BMI.  Gen:  WDWN WF NAD  Neurological: Alert and oriented to person, place, and time. Motor and  sensory function is grossly intact  Head: Normocephalic and atraumatic.  Eyes: Conjunctivae are normal. Pupils are equal, round, and reactive to light. No scleral icterus.  Neck: Normal range of motion. Neck supple. No tracheal deviation or thyromegaly present.  Cardiovascular:  SR without murmurs or gallops.  No carotid bruits Breast:  Not examined Respiratory: Effort normal.  No respiratory distress. No chest wall tenderness. Breath sounds normal.  No wheezes, rales or rhonchi.  Abdomen:  centripedal obesity GU:  Not examined Musculoskeletal: Normal range of motion. Extremities are nontender. No cyanosis, edema or clubbing noted Lymphadenopathy: No cervical, preauricular, postauricular or axillary adenopathy is present Skin: Skin is warm and dry. No  rash noted. No diaphoresis. No erythema. No pallor. Pscyh: Normal mood and affect. Behavior is normal. Judgment and thought content normal.   LABORATORY RESULTS: No results found for this or any previous visit (from the past 48 hour(s)).   RADIOLOGY RESULTS: No results found.  Problem List: Patient Active Problem List   Diagnosis Date Noted  . Diabetes mellitus with stage 2 chronic kidney disease ([place]) 03/14/2017  . Pure hypercholesterolemia 03/14/2017  . Morbid obesity with body mass index of 50.0-59.9 in adult ([place]) 03/26/2014  . HTN (hypertension), benign 12/27/2013    Assessment & Plan: Morbid obesity for sleeve gastrectomy.      Matt B. Hassell Done, MD, [facility] [phone] beeper [phone]  10/28/2019 4:57 PM

## 2019-10-29 ENCOUNTER — Inpatient Hospital Stay (HOSPITAL_COMMUNITY): Payer: 59 | Admitting: Anesthesiology

## 2019-10-29 ENCOUNTER — Other Ambulatory Visit: Payer: Self-pay

## 2019-10-29 ENCOUNTER — Inpatient Hospital Stay (HOSPITAL_COMMUNITY)
Admission: RE | Admit: 2019-10-29 | Discharge: 2019-10-31 | DRG: 621 | Disposition: A | Discharge status: Home / Self Care | Payer: 59 | Attending: Surgery | Admitting: Surgery

## 2019-10-29 ENCOUNTER — Encounter (HOSPITAL_COMMUNITY): Payer: Self-pay | Admitting: Surgery

## 2019-10-29 ENCOUNTER — Encounter (HOSPITAL_COMMUNITY)
Admission: RE | Disposition: A | Discharge status: Home / Self Care | Payer: Self-pay | Source: Home / Self Care | Attending: Surgery

## 2019-10-29 DIAGNOSIS — E785 Hyperlipidemia, unspecified: Secondary | ICD-10-CM | POA: Diagnosis present

## 2019-10-29 DIAGNOSIS — N182 Chronic kidney disease, stage 2 (mild): Secondary | ICD-10-CM | POA: Diagnosis present

## 2019-10-29 DIAGNOSIS — Z7984 Long term (current) use of oral hypoglycemic drugs: Secondary | ICD-10-CM | POA: Diagnosis not present

## 2019-10-29 DIAGNOSIS — I129 Hypertensive chronic kidney disease with stage 1 through stage 4 chronic kidney disease, or unspecified chronic kidney disease: Secondary | ICD-10-CM | POA: Diagnosis present

## 2019-10-29 DIAGNOSIS — Z79899 Other long term (current) drug therapy: Secondary | ICD-10-CM | POA: Diagnosis not present

## 2019-10-29 DIAGNOSIS — K219 Gastro-esophageal reflux disease without esophagitis: Secondary | ICD-10-CM | POA: Diagnosis present

## 2019-10-29 DIAGNOSIS — Z793 Long term (current) use of hormonal contraceptives: Secondary | ICD-10-CM | POA: Diagnosis not present

## 2019-10-29 DIAGNOSIS — E1122 Type 2 diabetes mellitus with diabetic chronic kidney disease: Secondary | ICD-10-CM | POA: Diagnosis present

## 2019-10-29 DIAGNOSIS — Z9884 Bariatric surgery status: Secondary | ICD-10-CM

## 2019-10-29 DIAGNOSIS — Z6841 Body Mass Index (BMI) 40.0 and over, adult: Secondary | ICD-10-CM | POA: Diagnosis not present

## 2019-10-29 HISTORY — PX: LAPAROSCOPIC GASTRIC SLEEVE RESECTION: SHX5895

## 2019-10-29 LAB — CBC WITH DIFFERENTIAL/PLATELET
Abs Immature Granulocytes: 0.02 10*3/uL (ref 0.00–0.07)
Basophils Absolute: 0 10*3/uL (ref 0.0–0.1)
Basophils Relative: 1 %
Eosinophils Absolute: 0.2 10*3/uL (ref 0.0–0.5)
Eosinophils Relative: 3 %
HCT: 39.3 % (ref 36.0–46.0)
Hemoglobin: 13.3 g/dL (ref 12.0–15.0)
Immature Granulocytes: 0 %
Lymphocytes Relative: 39 %
Lymphs Abs: 2.5 10*3/uL (ref 0.7–4.0)
MCH: 29.8 pg (ref 26.0–34.0)
MCHC: 33.8 g/dL (ref 30.0–36.0)
MCV: 88.1 fL (ref 80.0–100.0)
Monocytes Absolute: 0.6 10*3/uL (ref 0.1–1.0)
Monocytes Relative: 10 %
Neutro Abs: 3 10*3/uL (ref 1.7–7.7)
Neutrophils Relative %: 47 %
Platelets: 234 10*3/uL (ref 150–400)
RBC: 4.46 MIL/uL (ref 3.87–5.11)
RDW: 12.3 % (ref 11.5–15.5)
WBC: 6.4 10*3/uL (ref 4.0–10.5)
nRBC: 0 % (ref 0.0–0.2)

## 2019-10-29 LAB — COMPREHENSIVE METABOLIC PANEL
ALT: 34 U/L (ref 0–44)
AST: 24 U/L (ref 15–41)
Albumin: 4.1 g/dL (ref 3.5–5.0)
Alkaline Phosphatase: 43 U/L (ref 38–126)
Anion gap: 13 (ref 5–15)
BUN: 29 mg/dL — ABNORMAL HIGH (ref 6–20)
CO2: 18 mmol/L — ABNORMAL LOW (ref 22–32)
Calcium: 9.6 mg/dL (ref 8.9–10.3)
Chloride: 106 mmol/L (ref 98–111)
Creatinine, Ser: 1.04 mg/dL — ABNORMAL HIGH (ref 0.44–1.00)
GFR calc Af Amer: >60 mL/min (ref >60)
GFR calc non Af Amer: >60 mL/min (ref >60)
Glucose, Bld: 127 mg/dL — ABNORMAL HIGH (ref 70–99)
Potassium: 3.8 mmol/L (ref 3.5–5.1)
Sodium: 137 mmol/L (ref 135–145)
Total Bilirubin: 0.9 mg/dL (ref 0.3–1.2)
Total Protein: 7.3 g/dL (ref 6.5–8.1)

## 2019-10-29 LAB — GLUCOSE, CAPILLARY
Glucose-Capillary: 115 mg/dL — ABNORMAL HIGH (ref 70–99)
Glucose-Capillary: 199 mg/dL — ABNORMAL HIGH (ref 70–99)
Glucose-Capillary: 135 mg/dL — ABNORMAL HIGH (ref 70–99)
Glucose-Capillary: 146 mg/dL — ABNORMAL HIGH (ref 70–99)

## 2019-10-29 LAB — HEMOGLOBIN AND HEMATOCRIT, BLOOD
HCT: 38.5 % (ref 36.0–46.0)
Hemoglobin: 13 g/dL (ref 12.0–15.0)

## 2019-10-29 LAB — TYPE AND SCREEN
ABO/RH(D): O POS
Antibody Screen: NEGATIVE

## 2019-10-29 LAB — PREGNANCY, URINE: Preg Test, Ur: NEGATIVE

## 2019-10-29 LAB — ABO/RH: ABO/RH(D): O POS

## 2019-10-29 SURGERY — GASTRECTOMY, SLEEVE, LAPAROSCOPIC
Anesthesia: General | Site: Abdomen

## 2019-10-29 MED ORDER — LACTATED RINGERS IV SOLN: INTRAVENOUS | Status: DC

## 2019-10-29 MED ORDER — CHLORHEXIDINE GLUCONATE CLOTH 2 % EX PADS
6.0000 | MEDICATED_PAD | Freq: Once | CUTANEOUS | Status: AC
  Administered 2019-10-29: 6 via TOPICAL

## 2019-10-29 MED ORDER — GABAPENTIN 300 MG PO CAPS
300.0000 mg | ORAL_CAPSULE | ORAL | Status: AC
  Administered 2019-10-29: 300 mg via ORAL
  Filled 2019-10-29: qty 1

## 2019-10-29 MED ORDER — FENTANYL CITRATE (PF) 250 MCG/5ML IJ SOLN
INTRAMUSCULAR | Status: DC | PRN
  Administered 2019-10-29: 100 ug via INTRAVENOUS
  Administered 2019-10-29 (×2): 50 ug via INTRAVENOUS

## 2019-10-29 MED ORDER — DEXAMETHASONE SODIUM PHOSPHATE 10 MG/ML IJ SOLN
INTRAMUSCULAR | Status: DC | PRN
  Administered 2019-10-29: 8 mg via INTRAVENOUS

## 2019-10-29 MED ORDER — LIDOCAINE 2% (20 MG/ML) 5 ML SYRINGE
INTRAMUSCULAR | Status: DC | PRN
  Administered 2019-10-29: 1.5 mg/kg/h via INTRAVENOUS

## 2019-10-29 MED ORDER — SODIUM CHLORIDE (PF) 0.9 % IJ SOLN
INTRAMUSCULAR | Status: AC
  Filled 2019-10-29: qty 10

## 2019-10-29 MED ORDER — FENTANYL CITRATE (PF) 100 MCG/2ML IJ SOLN
INTRAMUSCULAR | Status: AC
  Administered 2019-10-29: 50 ug via INTRAVENOUS
  Filled 2019-10-29: qty 2

## 2019-10-29 MED ORDER — BUPIVACAINE LIPOSOME 1.3 % IJ SUSP
20.0000 mL | Freq: Once | INTRAMUSCULAR | Status: AC
  Administered 2019-10-29: 20 mL
  Filled 2019-10-29: qty 20

## 2019-10-29 MED ORDER — MIDAZOLAM HCL 2 MG/2ML IJ SOLN
INTRAMUSCULAR | Status: AC
  Filled 2019-10-29: qty 2

## 2019-10-29 MED ORDER — APREPITANT 40 MG PO CAPS
40.0000 mg | ORAL_CAPSULE | ORAL | Status: AC
  Administered 2019-10-29: 40 mg via ORAL
  Filled 2019-10-29: qty 1

## 2019-10-29 MED ORDER — ONDANSETRON HCL 4 MG/2ML IJ SOLN
4.0000 mg | INTRAMUSCULAR | Status: DC | PRN
  Administered 2019-10-29 – 2019-10-30 (×3): 4 mg via INTRAVENOUS
  Filled 2019-10-29 (×3): qty 2

## 2019-10-29 MED ORDER — ONDANSETRON HCL 4 MG/2ML IJ SOLN
INTRAMUSCULAR | Status: AC
  Filled 2019-10-29: qty 2

## 2019-10-29 MED ORDER — LIDOCAINE 2% (20 MG/ML) 5 ML SYRINGE
INTRAMUSCULAR | Status: DC | PRN
  Administered 2019-10-29: 60 mg via INTRAVENOUS

## 2019-10-29 MED ORDER — PROPOFOL 10 MG/ML IV BOLUS
INTRAVENOUS | Status: DC | PRN
  Administered 2019-10-29: 150 mg via INTRAVENOUS

## 2019-10-29 MED ORDER — OXYCODONE HCL 5 MG/5ML PO SOLN: 5.0000 mg | Freq: Four times a day (QID) | ORAL | Status: DC | PRN

## 2019-10-29 MED ORDER — ROCURONIUM BROMIDE 10 MG/ML (PF) SYRINGE
PREFILLED_SYRINGE | INTRAVENOUS | Status: AC
  Filled 2019-10-29: qty 10

## 2019-10-29 MED ORDER — DEXAMETHASONE SODIUM PHOSPHATE 10 MG/ML IJ SOLN
INTRAMUSCULAR | Status: AC
  Filled 2019-10-29: qty 1

## 2019-10-29 MED ORDER — HEPARIN SODIUM (PORCINE) 5000 UNIT/ML IJ SOLN
5000.0000 [IU] | Freq: Three times a day (TID) | INTRAMUSCULAR | Status: DC
  Administered 2019-10-29 – 2019-10-31 (×5): 5000 [IU] via SUBCUTANEOUS
  Filled 2019-10-29 (×4): qty 1

## 2019-10-29 MED ORDER — FENTANYL CITRATE (PF) 100 MCG/2ML IJ SOLN
25.0000 ug | INTRAMUSCULAR | Status: DC | PRN
  Administered 2019-10-29: 50 ug via INTRAVENOUS

## 2019-10-29 MED ORDER — PHENYLEPHRINE 40 MCG/ML (10ML) SYRINGE FOR IV PUSH (FOR BLOOD PRESSURE SUPPORT)
PREFILLED_SYRINGE | INTRAVENOUS | Status: DC | PRN
  Administered 2019-10-29: 100 ug via INTRAVENOUS
  Administered 2019-10-29: 40 ug via INTRAVENOUS

## 2019-10-29 MED ORDER — HEPARIN SODIUM (PORCINE) 5000 UNIT/ML IJ SOLN
5000.0000 [IU] | INTRAMUSCULAR | Status: AC
  Administered 2019-10-29: 5000 [IU] via SUBCUTANEOUS
  Filled 2019-10-29: qty 1

## 2019-10-29 MED ORDER — ENSURE MAX PROTEIN PO LIQD
2.0000 [oz_av] | ORAL | Status: DC
  Administered 2019-10-30 – 2019-10-31 (×9): 2 [oz_av] via ORAL

## 2019-10-29 MED ORDER — SUGAMMADEX SODIUM 200 MG/2ML IV SOLN
INTRAVENOUS | Status: DC | PRN
  Administered 2019-10-29: 256 mg via INTRAVENOUS

## 2019-10-29 MED ORDER — INSULIN ASPART 100 UNIT/ML ~~LOC~~ SOLN
0.0000 [IU] | SUBCUTANEOUS | Status: DC
  Administered 2019-10-29 – 2019-10-30 (×5): 3 [IU] via SUBCUTANEOUS
  Administered 2019-10-30: 09:00:00 4 [IU] via SUBCUTANEOUS
  Administered 2019-10-30 – 2019-10-31 (×5): 3 [IU] via SUBCUTANEOUS

## 2019-10-29 MED ORDER — ACETAMINOPHEN 500 MG PO TABS
1000.0000 mg | ORAL_TABLET | Freq: Three times a day (TID) | ORAL | Status: DC
  Administered 2019-10-29 – 2019-10-31 (×5): 1000 mg via ORAL
  Filled 2019-10-29 (×4): qty 2

## 2019-10-29 MED ORDER — METOPROLOL TARTRATE 5 MG/5ML IV SOLN: 5.0000 mg | Freq: Four times a day (QID) | INTRAVENOUS | Status: DC | PRN

## 2019-10-29 MED ORDER — ROCURONIUM BROMIDE 10 MG/ML (PF) SYRINGE
PREFILLED_SYRINGE | INTRAVENOUS | Status: DC | PRN
  Administered 2019-10-29: 10 mg via INTRAVENOUS
  Administered 2019-10-29: 100 mg via INTRAVENOUS
  Administered 2019-10-29: 10 mg via INTRAVENOUS

## 2019-10-29 MED ORDER — PROPOFOL 10 MG/ML IV BOLUS
INTRAVENOUS | Status: AC
  Filled 2019-10-29: qty 20

## 2019-10-29 MED ORDER — SODIUM CHLORIDE (PF) 0.9 % IJ SOLN
INTRAMUSCULAR | Status: DC | PRN
  Administered 2019-10-29: 10 mL

## 2019-10-29 MED ORDER — KCL IN DEXTROSE-NACL 20-5-0.45 MEQ/L-%-% IV SOLN
INTRAVENOUS | Status: DC
  Filled 2019-10-29 (×4): qty 1000

## 2019-10-29 MED ORDER — LIDOCAINE 2% (20 MG/ML) 5 ML SYRINGE
INTRAMUSCULAR | Status: AC
  Filled 2019-10-29: qty 10

## 2019-10-29 MED ORDER — ACETAMINOPHEN 500 MG PO TABS
1000.0000 mg | ORAL_TABLET | ORAL | Status: AC
  Administered 2019-10-29: 1000 mg via ORAL
  Filled 2019-10-29: qty 2

## 2019-10-29 MED ORDER — 0.9 % SODIUM CHLORIDE (POUR BTL) OPTIME
TOPICAL | Status: DC | PRN
  Administered 2019-10-29: 1000 mL

## 2019-10-29 MED ORDER — ACETAMINOPHEN 160 MG/5ML PO SOLN
1000.0000 mg | Freq: Three times a day (TID) | ORAL | Status: DC
  Filled 2019-10-29: qty 40.6

## 2019-10-29 MED ORDER — SUGAMMADEX SODIUM 500 MG/5ML IV SOLN
INTRAVENOUS | Status: AC
  Filled 2019-10-29: qty 5

## 2019-10-29 MED ORDER — PHENYLEPHRINE 40 MCG/ML (10ML) SYRINGE FOR IV PUSH (FOR BLOOD PRESSURE SUPPORT)
PREFILLED_SYRINGE | INTRAVENOUS | Status: AC
  Filled 2019-10-29: qty 10

## 2019-10-29 MED ORDER — PANTOPRAZOLE SODIUM 40 MG IV SOLR
40.0000 mg | Freq: Every day | INTRAVENOUS | Status: DC
  Administered 2019-10-29 – 2019-10-30 (×2): 40 mg via INTRAVENOUS
  Filled 2019-10-29 (×2): qty 40

## 2019-10-29 MED ORDER — SODIUM CHLORIDE 0.9 % IV SOLN
2.0000 g | INTRAVENOUS | Status: AC
  Administered 2019-10-29: 2 g via INTRAVENOUS
  Filled 2019-10-29: qty 2

## 2019-10-29 MED ORDER — MORPHINE SULFATE (PF) 2 MG/ML IV SOLN
1.0000 mg | INTRAVENOUS | Status: DC | PRN
  Administered 2019-10-30 (×2): 2 mg via INTRAVENOUS
  Filled 2019-10-29 (×2): qty 1

## 2019-10-29 MED ORDER — ONDANSETRON HCL 4 MG/2ML IJ SOLN
INTRAMUSCULAR | Status: DC | PRN
  Administered 2019-10-29: 4 mg via INTRAVENOUS

## 2019-10-29 MED ORDER — LACTATED RINGERS IR SOLN
Status: DC | PRN
  Administered 2019-10-29: 1000 mL

## 2019-10-29 MED ORDER — FENTANYL CITRATE (PF) 250 MCG/5ML IJ SOLN
INTRAMUSCULAR | Status: AC
  Filled 2019-10-29: qty 5

## 2019-10-29 MED ORDER — MIDAZOLAM HCL 5 MG/5ML IJ SOLN
INTRAMUSCULAR | Status: DC | PRN
  Administered 2019-10-29: 2 mg via INTRAVENOUS

## 2019-10-29 SURGICAL SUPPLY — 74 items
APPLICATOR COTTON TIP 6 STRL (MISCELLANEOUS) IMPLANT
APPLICATOR COTTON TIP 6IN STRL (MISCELLANEOUS)
APPLIER CLIP 5 13 M/L LIGAMAX5 (MISCELLANEOUS)
APPLIER CLIP ROT 10 11.4 M/L (STAPLE)
APPLIER CLIP ROT 13.4 12 LRG (CLIP)
BLADE SURG 15 STRL LF DISP TIS (BLADE) ×1 IMPLANT
BLADE SURG 15 STRL SS (BLADE) ×2
CABLE HIGH FREQUENCY MONO STRZ (ELECTRODE) ×1 IMPLANT
CLIP APPLIE 5 13 M/L LIGAMAX5 (MISCELLANEOUS) IMPLANT
CLIP APPLIE ROT 10 11.4 M/L (STAPLE) IMPLANT
CLIP APPLIE ROT 13.4 12 LRG (CLIP) IMPLANT
COVER BACK TABLE 60X90IN (DRAPES) ×2 IMPLANT
COVER MAYO STAND STRL (DRAPES) ×2 IMPLANT
COVER WAND RF STERILE (DRAPES) IMPLANT
DECANTER SPIKE VIAL GLASS SM (MISCELLANEOUS) ×3 IMPLANT
DERMABOND ADVANCED (GAUZE/BANDAGES/DRESSINGS) ×2
DERMABOND ADVANCED .7 DNX12 (GAUZE/BANDAGES/DRESSINGS) IMPLANT
DEVICE SUT QUICK LOAD TK 5 (STAPLE) IMPLANT
DEVICE SUT TI-KNOT TK 5X26 (MISCELLANEOUS) IMPLANT
DEVICE SUTURE ENDOST 10MM (ENDOMECHANICALS) IMPLANT
DEVICE TI KNOT TK5 (MISCELLANEOUS)
DISSECTOR BLUNT TIP ENDO 5MM (MISCELLANEOUS) IMPLANT
ELECT REM PT RETURN 15FT ADLT (MISCELLANEOUS) ×3 IMPLANT
GAUZE SPONGE 4X4 12PLY STRL (GAUZE/BANDAGES/DRESSINGS) IMPLANT
GLOVE BIO SURGEON STRL SZ 6 (GLOVE) ×2 IMPLANT
GLOVE BIOGEL M 8.0 STRL (GLOVE) ×3 IMPLANT
GLOVE BIOGEL PI IND STRL 7.0 (GLOVE) IMPLANT
GLOVE BIOGEL PI IND STRL 7.5 (GLOVE) IMPLANT
GLOVE BIOGEL PI INDICATOR 7.0 (GLOVE) ×2
GLOVE BIOGEL PI INDICATOR 7.5 (GLOVE) ×2
GLOVE ECLIPSE 6.5 STRL STRAW (GLOVE) ×2 IMPLANT
GLOVE INDICATOR 6.5 STRL GRN (GLOVE) ×2 IMPLANT
GLOVE SURG SS PI 7.0 STRL IVOR (GLOVE) ×2 IMPLANT
GOWN STRL REUS W/TWL LRG LVL3 (GOWN DISPOSABLE) ×2 IMPLANT
GOWN STRL REUS W/TWL XL LVL3 (GOWN DISPOSABLE) ×12 IMPLANT
GRASPER SUT TROCAR 14GX15 (MISCELLANEOUS) ×3 IMPLANT
HANDLE STAPLE EGIA 4 XL (STAPLE) ×3 IMPLANT
HOVERMATT SINGLE USE (MISCELLANEOUS) ×3 IMPLANT
KIT BASIN OR (CUSTOM PROCEDURE TRAY) ×3 IMPLANT
KIT TURNOVER KIT A (KITS) IMPLANT
MARKER SKIN DUAL TIP RULER LAB (MISCELLANEOUS) ×3 IMPLANT
NDL SPNL 22GX3.5 QUINCKE BK (NEEDLE) ×1 IMPLANT
NEEDLE SPNL 22GX3.5 QUINCKE BK (NEEDLE) ×3 IMPLANT
QUICK LOAD TK 5 (STAPLE)
RELOAD STAPLE 45 PURP MED/THCK (STAPLE) IMPLANT
RELOAD TRI 45 ART MED THCK BLK (STAPLE) ×3 IMPLANT
RELOAD TRI 45 ART MED THCK PUR (STAPLE) IMPLANT
RELOAD TRI 60 ART MED THCK BLK (STAPLE) ×3 IMPLANT
RELOAD TRI 60 ART MED THCK PUR (STAPLE) ×8 IMPLANT
SCISSORS LAP 5X45 EPIX DISP (ENDOMECHANICALS) IMPLANT
SET IRRIG TUBING LAPAROSCOPIC (IRRIGATION / IRRIGATOR) ×3 IMPLANT
SET TUBE SMOKE EVAC HIGH FLOW (TUBING) ×3 IMPLANT
SHEARS HARMONIC ACE PLUS 45CM (MISCELLANEOUS) ×3 IMPLANT
SLEEVE ADV FIXATION 5X100MM (TROCAR) ×6 IMPLANT
SLEEVE GASTRECTOMY 36FR VISIGI (MISCELLANEOUS) ×3 IMPLANT
SOL ANTI FOG 6CC (MISCELLANEOUS) ×1 IMPLANT
SOLUTION ANTI FOG 6CC (MISCELLANEOUS) ×2
SPONGE LAP 18X18 RF (DISPOSABLE) ×5 IMPLANT
STAPLER VISISTAT 35W (STAPLE) IMPLANT
SUT MNCRL AB 4-0 PS2 18 (SUTURE) ×6 IMPLANT
SUT SURGIDAC NAB ES-9 0 48 120 (SUTURE) IMPLANT
SUT VICRYL 0 TIES 12 18 (SUTURE) ×3 IMPLANT
SYR 10ML ECCENTRIC (SYRINGE) ×3 IMPLANT
SYR 20ML LL LF (SYRINGE) ×3 IMPLANT
TOWEL OR 17X26 10 PK STRL BLUE (TOWEL DISPOSABLE) ×3 IMPLANT
TOWEL OR NON WOVEN STRL DISP B (DISPOSABLE) ×3 IMPLANT
TRAY FOLEY MTR SLVR 16FR STAT (SET/KITS/TRAYS/PACK) IMPLANT
TROCAR ADV FIXATION 5X100MM (TROCAR) ×3 IMPLANT
TROCAR BLADELESS 15MM (ENDOMECHANICALS) ×3 IMPLANT
TROCAR BLADELESS OPT 5 100 (ENDOMECHANICALS) ×3 IMPLANT
TUBE CALIBRATION LAPBAND (TUBING) IMPLANT
TUBING CONNECTING 10 (TUBING) ×3 IMPLANT
TUBING CONNECTING 10' (TUBING) ×1
TUBING ENDO SMARTCAP (MISCELLANEOUS) ×3 IMPLANT

## 2019-10-29 NOTE — Interval H&P Note (Signed)
History and Physical Interval Note:  10/29/2019 11:36 AM  Rachel Gordon  has presented today for surgery, with the diagnosis of Morbid Obesity.  The various methods of treatment have been discussed with the patient and family. After consideration of risks, benefits and other options for treatment, the patient has consented to  Procedure(s): LAPAROSCOPIC GASTRIC SLEEVE RESECTION, Upper Endo, ERAS Pathway (N/A) as a surgical intervention.  The patient's history has been reviewed, patient examined, no change in status, stable for surgery.  I have reviewed the patient's chart and labs.  Questions were answered to the patient's satisfaction.     Pedro Earls

## 2019-10-29 NOTE — Interval H&P Note (Signed)
History and Physical Interval Note:  10/29/2019 11:38 AM  Rachel Gordon  has presented today for surgery, with the diagnosis of Morbid Obesity.  The various methods of treatment have been discussed with the patient and family. After consideration of risks, benefits and other options for treatment, the patient has consented to  Procedure(s): LAPAROSCOPIC GASTRIC SLEEVE RESECTION, Upper Endo, ERAS Pathway (N/A) as a surgical intervention.  The patient's history has been reviewed, patient examined, no change in status, stable for surgery.  I have reviewed the patient's chart and labs.  Questions were answered to the patient's satisfaction.     Pedro Earls

## 2019-10-29 NOTE — Op Note (Signed)
Preoperative diagnosis: laparoscopic sleeve gastrectomy  Postoperative diagnosis: Same   Procedure: Upper endoscopy   Surgeon: Clovis Riley, M.D.  Anesthesia: Gen.   Description of procedure: The endoscopy was placed in the mouth and into the oropharynx and under endoscopic vision it was advanced to the esophagogastric junction which was identified at 38cm from the teeth.  The pouch was tensely insufflated while the upper abdomen was flooded with irrigation to perform a leak test, which was negative. No bubbles were seen.  The staple line was hemostatic and the lumen was evenly tubular without undue narrowing, angulation or twisting specifically at the incisura angularis. The lumen was decompressed and the scope was withdrawn without difficulty.    Clovis Riley, M.D. General, Bariatric, & Minimally Invasive Surgery [facility]

## 2019-10-29 NOTE — Anesthesia Procedure Notes (Signed)
Procedure Name: Intubation Date/Time: 10/29/2019 12:05 PM Performed by: Anne Fu, CRNA Pre-anesthesia Checklist: Patient identified, Emergency Drugs available, Suction available, Patient being monitored and Timeout performed Patient Re-evaluated:Patient Re-evaluated prior to induction Oxygen Delivery Method: Circle system utilized Preoxygenation: Pre-oxygenation with 100% oxygen Induction Type: IV induction Ventilation: Mask ventilation without difficulty Laryngoscope Size: Mac and 4 Grade View: Grade III Tube type: Oral Tube size: 7.5 mm Number of attempts: 1 Airway Equipment and Method: Stylet Placement Confirmation: ETT inserted through vocal cords under direct vision,  positive ETCO2 and breath sounds checked- equal and bilateral Secured at: 23 cm Tube secured with: Tape Dental Injury: Teeth and Oropharynx as per pre-operative assessment  Difficulty Due To: Difficult Airway- due to anterior larynx and Difficulty was anticipated

## 2019-10-29 NOTE — Progress Notes (Signed)
PHARMACY CONSULT FOR:  Risk Assessment for Post-Discharge VTE Following Bariatric Surgery  Post-Discharge VTE Risk Assessment: This patient's probability of 30-day post-discharge VTE is increased due to the factors marked:   Female    Age >/=60 years    BMI >/=50 kg/m2    CHF    Dyspnea at Rest    Paraplegia   X Non-gastric-band surgery    Operation Time >/=3 hr    Return to OR     Length of Stay >/= 3 d      Hx of VTE   Hypercoagulable condition   Significant venous stasis   Predicted probability of 30-day post-discharge VTE:  0.16%   Recommendation for Discharge: No pharmacologic prophylaxis post-discharge   Rachel Gordon is a 54 y.o. female who underwent  Laparoscopic gastric sleeve resection on 10/29/19    Case start: 1233 Case end: 1353   No Known Allergies  Patient Measurements: Height: 5' 4" (162.6 cm) Weight: 128 kg (282 lb 3 oz) IBW/kg (Calculated) : 54.7 Body mass index is 48.44 kg/m.  Recent Labs    10/29/19 1023  WBC 6.4  HGB 13.3  HCT 39.3  PLT 234  CREATININE 1.04*  ALBUMIN 4.1  PROT 7.3  AST 24  ALT 34  ALKPHOS 43  BILITOT 0.9   Estimated Creatinine Clearance: 84.9 mL/min (A) (by C-G formula based on SCr of 1.04 mg/dL (H)).    Past Medical History:  Diagnosis Date  . Abnormal uterine bleeding (AUB)   . Anemia   . Chronic kidney disease   . DM (diabetes mellitus) ([place])   . Fibroid   . GERD (gastroesophageal reflux disease)   . Headache    h/o  . Hyperlipidemia   . Hyperlipidemia   . Hypertension   . Metrorrhagia   . Obesity    bmi 54  . PCOS (polycystic ovarian syndrome)   . S/P endometrial ablation   . Vaginal Pap smear, abnormal 2009   pos hvp     Medications Prior to Admission  Medication Sig Dispense Refill Last Dose  . CALCIUM PO Take 1 tablet by mouth 3 (three) times daily.   Past Week at Unknown time  . lisinopril-hydrochlorothiazide (ZESTORETIC) 20-12.5 MG tablet Take 1 tablet by mouth daily. 90 tablet 1  10/28/2019 at 0800  . metFORMIN (GLUCOPHAGE-XR) 500 MG 24 hr tablet Take 1,000 mg by mouth in the morning and at bedtime.   10/28/2019 at 2100  . naproxen sodium (ALEVE) 220 MG tablet Take 440 mg by mouth daily as needed (pain).   Past Month at Unknown time  . norethindrone (AYGESTIN) 5 MG tablet TAKE ONE TABLET DAILY (Patient taking differently: Take 5 mg by mouth daily. ) 90 tablet 3 10/28/2019 at 0800  . RYBELSUS 14 MG TABS TAKE 1 TABLET BY MOUTH EVERY DAY (Patient taking differently: Take 14 mg by mouth daily. ) 30 tablet 3 10/28/2019 at 0800  . simvastatin (ZOCOR) 40 MG tablet Take 40 mg by mouth at bedtime.    10/28/2019 at 0800  . Vitamin D, Ergocalciferol, (DRISDOL) 1.25 MG (50000 UNIT) CAPS capsule TAKE 1 CAPSULE (50,000 UNITS TOTAL) BY MOUTH EVERY 7 (SEVEN) DAYS. (Patient taking differently: Take 50,000 Units by mouth every Sunday. ) 12 capsule 0 10/27/2019  . Blood Glucose Monitoring Suppl (ONE TOUCH ULTRA 2) w/Device KIT 1 each by Does not apply route 2 (two) times daily. 1 each 0   . glucose blood (ONE TOUCH ULTRA TEST) test strip Use as instructed 100  each 12   . Lancets (ONETOUCH ULTRASOFT) lancets Use as instructed 100 each 12   . omeprazole (PRILOSEC) 40 MG capsule Take 40 mg by mouth daily.         Everette Rank, PharmD 10/29/2019,4:25 PM

## 2019-10-29 NOTE — Transfer of Care (Signed)
Immediate Anesthesia Transfer of Care Note  Patient: Rachel Gordon  Procedure(s) Performed: Procedure(s): LAPAROSCOPIC GASTRIC SLEEVE RESECTION, Upper Endo, ERAS Pathway (N/A)  Patient Location: PACU  Anesthesia Type:General  Level of Consciousness:  sedated, patient cooperative and responds to stimulation  Airway & Oxygen Therapy:Patient Spontanous Breathing and Patient connected to face mask oxgen  Post-op Assessment:  Report given to PACU RN and Post -op Vital signs reviewed and stable  Post vital signs:  Reviewed and stable  Last Vitals:  Vitals:   10/29/19 1032  BP: (!) 156/85  Pulse: 95  Resp: 18  Temp: 37.7 C  SpO2: [id number]    Complications: No apparent anesthesia complications

## 2019-10-29 NOTE — Anesthesia Postprocedure Evaluation (Signed)
Anesthesia Post Note  Patient: Rachel Gordon  Procedure(s) Performed: LAPAROSCOPIC GASTRIC SLEEVE RESECTION, Upper Endo, ERAS Pathway (N/A Abdomen)     Patient location during evaluation: PACU Anesthesia Type: General Level of consciousness: awake and alert Pain management: pain level controlled Vital Signs Assessment: post-procedure vital signs reviewed and stable Respiratory status: spontaneous breathing, nonlabored ventilation, respiratory function stable and patient connected to nasal cannula oxygen Cardiovascular status: blood pressure returned to baseline and stable Postop Assessment: no apparent nausea or vomiting Anesthetic complications: no    Last Vitals:  Vitals:   10/29/19 1533 10/29/19 1612  BP: 135/72 126/69  Pulse: 81 75  Resp: 20 18  Temp: (!) 36.3 C 36.4 C  SpO2: 95% 98%    Last Pain:  Vitals:   10/29/19 1612  TempSrc: Oral  PainSc: 5                  Tiajuana Amass

## 2019-10-29 NOTE — Progress Notes (Signed)
Pt more alert, burping a lot, but nauseated.  She is not ready to start water but I did give her some ice chips.

## 2019-10-29 NOTE — Discharge Instructions (Signed)
° ° ° °GASTRIC BYPASS/SLEEVE ° Home Care Instructions ° ° These instructions are to help you care for yourself when you go home. ° °Call: If you have any problems. °• Call [phone] and ask for the surgeon on call °• If you need immediate help, come to the ER at [place].  °• Tell the ER staff that you are a new post-op gastric bypass or gastric sleeve patient °  °Signs and symptoms to report: • Severe vomiting or nausea °o If you cannot keep down clear liquids for longer than 1 day, call your surgeon  °• Abdominal pain that does not get better after taking your pain medication °• Fever over 100.4° F with chills °• Heart beating over 100 beats a minute °• Shortness of breath at rest °• Chest pain °•  Redness, swelling, drainage, or foul odor at incision (surgical) sites °•  If your incisions open or pull apart °• Swelling or pain in calf (lower leg) °• Diarrhea (Loose bowel movements that happen often), frequent watery, uncontrolled bowel movements °• Constipation, (no bowel movements for 3 days) if this happens: Pick one °o Milk of Magnesia, 2 tablespoons by mouth, 3 times a day for 2 days if needed °o Stop taking Milk of Magnesia once you have a bowel movement °o Call your doctor if constipation continues °Or °o Miralax  (instead of Milk of Magnesia) following the label instructions °o Stop taking Miralax once you have a bowel movement °o Call your doctor if constipation continues °• Anything you think is not normal °  °Normal side effects after surgery: • Unable to sleep at night or unable to focus °• Irritability or moody °• Being tearful (crying) or depressed °These are common complaints, possibly related to your anesthesia medications that put you to sleep, stress of surgery, and change in lifestyle.  This usually goes away a few weeks after surgery.  If these feelings continue, call your primary care doctor. °  °Wound Care: You may have surgical glue, steri-strips, or staples over your incisions after  surgery °• Surgical glue:  Looks like a clear film over your incisions and will wear off a little at a time °• Steri-strips: Strips of tape over your incisions. You may notice a yellowish color on the skin under the steri-strips. This is used to make the   steri-strips stick better. Do not pull the steri-strips off - let them fall off °• Staples: Staples may be removed before you leave the hospital °o If you go home with staples, call [facility], [phone] at for an appointment with your surgeon’s nurse to have staples removed 10 days after surgery. °• Showering: You may shower two (2) days after your surgery unless your surgeon tells you differently °o Wash gently around incisions with warm soapy water, rinse well, and gently pat dry  °o No tub baths until staples are removed, steri-strips fall off or glue is gone.  °  °Medications: • Medications should be liquid or crushed if larger than the size of a dime °• Extended release pills (medication that release a little bit at a time through the day) should NOT be crushed or cut. (examples include XL, ER, DR, SR) °• Depending on the size and number of medications you take, you may need to space (take a few throughout the day)/change the time you take your medications so that you do not over-fill your pouch (smaller stomach) °• Make sure you follow-up with your primary care doctor to   make medication changes needed during rapid weight loss and life-style changes °• If you have diabetes, follow up with the doctor that orders your diabetes medication(s) within one week after surgery and check your blood sugar regularly. °• Do not drive while taking prescription pain medication  °• It is ok to take Tylenol by the bottle instructions with your pain medicine or instead of your pain medicine as needed.  DO NOT TAKE NSAIDS (EXAMPLES OF NSAIDS:  IBUPROFREN/ NAPROXEN)  °Diet:                    First 2 Weeks ° You will see the dietician t about two (2) weeks  after your surgery. The dietician will increase the types of foods you can eat if you are handling liquids well: °• If you have severe vomiting or nausea and cannot keep down clear liquids lasting longer than 1 day, call your surgeon @ ([phone]) °Protein Shake °• Drink at least 2 ounces of shake 5-6 times per day °• Each serving of protein shakes (usually 8 - 12 ounces) should have: °o 15 grams of protein  °o And no more than 5 grams of carbohydrate  °• Goal for protein each day: °o Men = 80 grams per day °o Women = 60 grams per day °• Protein powder may be added to fluids such as non-fat milk or Lactaid milk or unsweetened Soy/Almond milk (limit to 35 grams added protein powder per serving) ° °Hydration °• Slowly increase the amount of water and other clear liquids as tolerated (See Acceptable Fluids) °• Slowly increase the amount of protein shake as tolerated  °•  Sip fluids slowly and throughout the day.  Do not use straws. °• May use sugar substitutes in small amounts (no more than 6 - 8 packets per day; i.e. Splenda) ° °Fluid Goal °• The first goal is to drink at least 8 ounces of protein shake/drink per day (or as directed by the nutritionist); some examples of protein shakes are Syntrax Nectar, Adkins Advantage, EAS Edge HP, and Unjury. See handout from pre-op Bariatric Education Class: °o Slowly increase the amount of protein shake you drink as tolerated °o You may find it easier to slowly sip shakes throughout the day °o It is important to get your proteins in first °• Your fluid goal is to drink 64 - 100 ounces of fluid daily °o It may take a few weeks to build up to this °• 32 oz (or more) should be clear liquids  °And  °• 32 oz (or more) should be full liquids (see below for examples) °• Liquids should not contain sugar, caffeine, or carbonation ° °Clear Liquids: °• Water or Sugar-free flavored water (i.e. Fruit H2O, Propel) °• Decaffeinated coffee or tea (sugar-free) °• Crystal Lite, Wyler’s Lite,  Minute Maid Lite °• Sugar-free Jell-O °• Bouillon or broth °• Sugar-free Popsicle:   *Less than 20 calories each; Limit 1 per day ° °Full Liquids: °Protein Shakes/Drinks + 2 choices per day of other full liquids °• Full liquids must be: °o No More Than 15 grams of Carbs per serving  °o No More Than 3 grams of Fat per serving °• Strained low-fat cream soup (except Cream of Potato or Tomato) °• Non-Fat milk °• Fat-free Lactaid Milk °• Unsweetened Soy Or Unsweetened Almond Milk °• Low Sugar yogurt (Dannon Lite & Fit, Greek yogurt; Oikos Triple Zero; Chobani Simply 100; Yoplait 100 calorie Greek - No Fruit on the Bottom) ° °  °Vitamins   and Minerals • Start 1 day after surgery unless otherwise directed by your surgeon °• 2 Chewable Bariatric Specific Multivitamin / Multimineral Supplement with iron (Example: Bariatric Advantage Multi EA) °• Chewable Calcium with Vitamin D-3 °(Example: 3 Chewable Calcium Plus 600 with Vitamin D-3) °o Take 500 mg three (3) times a day for a total of 1500 mg each day °o Do not take all 3 doses of calcium at one time as it may cause constipation, and you can only absorb 500 mg  at a time  °o Do not mix multivitamins containing iron with calcium supplements; take 2 hours apart °• Menstruating women and those with a history of anemia (a blood disease that causes weakness) may need extra iron °o Talk with your doctor to see if you need more iron °• Do not stop taking or change any vitamins or minerals until you talk to your dietitian or surgeon °• Your Dietitian and/or surgeon must approve all vitamin and mineral supplements °  °Activity and Exercise: Limit your physical activity as instructed by your doctor.  It is important to continue walking at home.  During this time, use these guidelines: °• Do not lift anything greater than ten (10) pounds for at least two (2) weeks °• Do not go back to work or drive until your surgeon says you can °• You may have sex when you feel comfortable  °o It is  VERY important for female patients to use a reliable birth control method; fertility often increases after surgery  °o All hormonal birth control will be ineffective for 30 days after surgery due to medications given during surgery a barrier method must be used. °o Do not get pregnant for at least 18 months °• Start exercising as soon as your doctor tells you that you can °o Make sure your doctor approves any physical activity °• Start with a simple walking program °• Walk 5-15 minutes each day, 7 days per week.  °• Slowly increase until you are walking 30-45 minutes per day °Consider joining our BELT program. [phone] or email [email or web address] °  °Special Instructions Things to remember: °• Use your CPAP when sleeping if this applies to you ° °• [facility] has two free Bariatric Surgery Support Groups that meet monthly °o The 3rd Thursday of each month, 6 pm, [facility]  °o The 2nd Friday of each month, 11:45 am in the private dining room in the basement of [facility] °• It is very important to keep all follow up appointments with your surgeon, dietitian, primary care physician, and behavioral health practitioner °• Routine follow up schedule with your surgeon include appointments at 2-3 weeks, 6-8 weeks, 6 months, and 1 year at a minimum.  Your surgeon may request to see you more often.   °o After the first year, please follow up with your bariatric surgeon and dietitian at least once a year in order to maintain best weight loss results °[facility]: [phone] °[facility]: [phone] °Bariatric Nurse Coordinator: [phone] °  °   Reviewed and Endorsed  °by [place] Patient Education Committee, June, 2016 °Edits Approved: Aug, 2018 ° ° ° °

## 2019-10-29 NOTE — Op Note (Signed)
29 October 2019  Surgeon: Kaylyn Lim, MD, FACS  Asst:  Romana Juniper, MD, FACS  Anes:  General endotracheal  Procedure: Laparoscopic sleeve gastrectomy and upper endoscopy  Diagnosis: Morbid obesity  Complications: none  EBL:   20 cc  Description of Procedure:  The patient was take to OR 2 and given general anesthesia.  The abdomen was prepped with Monocryl and draped sterilely.  A timeout was performed.  Access to the abdomen was achieved with a 5 mm Optiview through the left upper quadrant.  Following insufflation, the state of the abdomen was found to be free of adhesions.  The ViSiGi 36Fr tube was inserted to deflate the stomach and was pulled back into the esophagus.    The pylorus was identified and we measured 5 cm back and marked the antrum.  At that point we began dissection to take down the greater curvature of the stomach using the Harmonic scalpel.  This dissection was taken all the way up to the left crus.  Posterior attachments of the stomach were also taken down.    The ViSiGi tube was then passed into the antrum and suction applied so that it was snug along the lessor curvature.  The "crow's foot" or incisura was identified.  The sleeve gastrectomy was begun using the [employer] stapler beginning with a 4.5 cm black load followed by a 6 cm black load and then multiple firings of the purple load with TRS.Marland Kitchen  When the sleeve was complete the tube was taken off suction and insufflated briefly.  The tube was withdrawn.  Upper endoscopy was then performed by Dr. Kae Heller.     The specimen was extracted through the 15 trocar site. This was irrigated and closed  Local was provided by infiltrating with 30 cc TAP block and closed 4-0 Monocryl and Dermabond.    Matt B. Hassell Done, [place], [facility], [place]

## 2019-10-29 NOTE — Progress Notes (Signed)
Discussed post op day goals with patient including ambulation, IS, diet progression, pain, and nausea control.  BSTOP education provided including BSTOP information guide, "Guide for Pain Management after your Bariatric Procedure".  Questions answered. 

## 2019-10-29 NOTE — Progress Notes (Signed)
NUTRITION NOTE  Consult received for Bariatric Diet teaching. Patient is POD #0 lap sleeve gastrectomy and upper endoscopy.  Bariatric Nurse Educator saw patient earlier this afternoon and reviewed all post-op information, including diet-related information.  If additional nutrition-related needs arise, please re-consult RD.    Jarome Matin, MS, RD, LDN, CNSC Inpatient Clinical Dietitian RD pager # available in [place]  After hours/weekend pager # available in [facility]

## 2019-10-29 NOTE — Anesthesia Preprocedure Evaluation (Addendum)
Anesthesia Evaluation  Patient identified by MRN, date of birth, ID band Patient awake    Reviewed: Allergy & Precautions, NPO status , Patient's Chart, lab work & pertinent test results  Airway Mallampati: III  TM Distance: >3 FB Neck ROM: Full  Mouth opening: Limited Mouth Opening  Dental no notable dental hx. (+) Chipped, Dental Advisory Given,    Pulmonary neg pulmonary ROS,    Pulmonary exam normal breath sounds clear to auscultation       Cardiovascular hypertension, Pt. on medications Normal cardiovascular exam Rhythm:Regular Rate:Normal  HLD  EKG 05/2019 Normal sinus rhythm Right bundle branch block Inferior infarct , age undetermined   Neuro/Psych  Headaches, negative psych ROS   GI/Hepatic Neg liver ROS, GERD  Medicated,  Endo/Other  diabetes (A1C 6.5), Oral Hypoglycemic AgentsMorbid obesityPCOS  Renal/GU Renal InsufficiencyRenal disease (Stage II, Cr 0.91, K 4.9)  negative genitourinary   Musculoskeletal negative musculoskeletal ROS (+)   Abdominal   Peds  Hematology negative hematology ROS (+)   Anesthesia Other Findings   Reproductive/Obstetrics                            Anesthesia Physical Anesthesia Plan  ASA: III  Anesthesia Plan: General   Post-op Pain Management:    Induction: Intravenous  PONV Risk Score and Plan: 3 and Midazolam, Dexamethasone and Ondansetron  Airway Management Planned: Oral ETT  Additional Equipment:   Intra-op Plan:   Post-operative Plan: Extubation in OR  Informed Consent: I have reviewed the patients History and Physical, chart, labs and discussed the procedure including the risks, benefits and alternatives for the proposed anesthesia with the patient or authorized representative who has indicated his/her understanding and acceptance.     Dental advisory given  Plan Discussed with: CRNA  Anesthesia Plan Comments:          Anesthesia Quick Evaluation

## 2019-10-30 LAB — CBC WITH DIFFERENTIAL/PLATELET
Abs Immature Granulocytes: 0.04 10*3/uL (ref 0.00–0.07)
Basophils Absolute: 0 10*3/uL (ref 0.0–0.1)
Basophils Relative: 0 %
Eosinophils Absolute: 0 10*3/uL (ref 0.0–0.5)
Eosinophils Relative: 0 %
HCT: 35.8 % — ABNORMAL LOW (ref 36.0–46.0)
Hemoglobin: 12.3 g/dL (ref 12.0–15.0)
Immature Granulocytes: 0 %
Lymphocytes Relative: 10 %
Lymphs Abs: 1 10*3/uL (ref 0.7–4.0)
MCH: 30.1 pg (ref 26.0–34.0)
MCHC: 34.4 g/dL (ref 30.0–36.0)
MCV: 87.5 fL (ref 80.0–100.0)
Monocytes Absolute: 0.8 10*3/uL (ref 0.1–1.0)
Monocytes Relative: 8 %
Neutro Abs: 8.3 10*3/uL — ABNORMAL HIGH (ref 1.7–7.7)
Neutrophils Relative %: 82 %
Platelets: 213 10*3/uL (ref 150–400)
RBC: 4.09 MIL/uL (ref 3.87–5.11)
RDW: 12.2 % (ref 11.5–15.5)
WBC: 10.1 10*3/uL (ref 4.0–10.5)
nRBC: 0 % (ref 0.0–0.2)

## 2019-10-30 LAB — GLUCOSE, CAPILLARY
Glucose-Capillary: 123 mg/dL — ABNORMAL HIGH (ref 70–99)
Glucose-Capillary: 123 mg/dL — ABNORMAL HIGH (ref 70–99)
Glucose-Capillary: 130 mg/dL — ABNORMAL HIGH (ref 70–99)
Glucose-Capillary: 134 mg/dL — ABNORMAL HIGH (ref 70–99)
Glucose-Capillary: 142 mg/dL — ABNORMAL HIGH (ref 70–99)
Glucose-Capillary: 152 mg/dL — ABNORMAL HIGH (ref 70–99)
Glucose-Capillary: 158 mg/dL — ABNORMAL HIGH (ref 70–99)

## 2019-10-30 LAB — SURGICAL PATHOLOGY

## 2019-10-30 NOTE — Progress Notes (Signed)
Better nausea control, but has not met fluid goals for discharge.  Goals provided to patient along with coaching in regards to increasing fluid intake.  Discussed with Dr Hassell Done.  Will continue to monitor fluid intake overnight.  Family at bedside no question from patient or family at this time.

## 2019-10-30 NOTE — Progress Notes (Signed)
Patient alert and oriented, Post op day 1.  Provided support and encouragement.  Encouraged pulmonary toilet, ambulation and small sips of liquids.  All questions answered.  Will continue to monitor. 

## 2019-10-31 LAB — CBC WITH DIFFERENTIAL/PLATELET
Abs Immature Granulocytes: 0.04 10*3/uL (ref 0.00–0.07)
Basophils Absolute: 0 10*3/uL (ref 0.0–0.1)
Basophils Relative: 1 %
Eosinophils Absolute: 0 10*3/uL (ref 0.0–0.5)
Eosinophils Relative: 1 %
HCT: 34.5 % — ABNORMAL LOW (ref 36.0–46.0)
Hemoglobin: 11.3 g/dL — ABNORMAL LOW (ref 12.0–15.0)
Immature Granulocytes: 1 %
Lymphocytes Relative: 22 %
Lymphs Abs: 1.9 10*3/uL (ref 0.7–4.0)
MCH: 29.7 pg (ref 26.0–34.0)
MCHC: 32.8 g/dL (ref 30.0–36.0)
MCV: 90.8 fL (ref 80.0–100.0)
Monocytes Absolute: 0.8 10*3/uL (ref 0.1–1.0)
Monocytes Relative: 9 %
Neutro Abs: 5.9 10*3/uL (ref 1.7–7.7)
Neutrophils Relative %: 66 %
Platelets: 213 10*3/uL (ref 150–400)
RBC: 3.8 MIL/uL — ABNORMAL LOW (ref 3.87–5.11)
RDW: 12.3 % (ref 11.5–15.5)
WBC: 8.7 10*3/uL (ref 4.0–10.5)
nRBC: 0 % (ref 0.0–0.2)

## 2019-10-31 LAB — GLUCOSE, CAPILLARY
Glucose-Capillary: 139 mg/dL — ABNORMAL HIGH (ref 70–99)
Glucose-Capillary: 139 mg/dL — ABNORMAL HIGH (ref 70–99)

## 2019-10-31 MED ORDER — OXYCODONE HCL 5 MG PO TABS: 5.0000 mg | ORAL_TABLET | Freq: Four times a day (QID) | ORAL | 0 refills | Status: DC | PRN

## 2019-10-31 MED ORDER — ONDANSETRON 4 MG PO TBDP: 4.0000 mg | ORAL_TABLET | Freq: Four times a day (QID) | ORAL | 0 refills | Status: DC | PRN

## 2019-10-31 MED ORDER — PANTOPRAZOLE SODIUM 40 MG PO TBEC: 40.0000 mg | DELAYED_RELEASE_TABLET | Freq: Every day | ORAL | 0 refills | Status: DC

## 2019-10-31 NOTE — Progress Notes (Signed)
Patient alert and oriented, pain is controlled. Patient is tolerating fluids, advanced to protein shake today, patient is tolerating well.  Reviewed Gastric sleeve discharge instructions with patient and patient is able to articulate understanding.  Provided information on BELT program, Support Group and [facility] outpatient pharmacy. All questions answered, will continue to monitor.  Total fluid intake 570 Per dehydration protocol call back one week post op

## 2019-10-31 NOTE — Plan of Care (Signed)
  Problem: Education: Goal: Ability to state signs and symptoms to report to health care provider will improve Outcome: Progressing Goal: Knowledge of the prescribed self-care regimen will improve Outcome: Progressing Goal: Knowledge of discharge needs will improve Outcome: Progressing   Problem: Activity: Goal: Ability to tolerate increased activity will improve Outcome: Progressing   Problem: Bowel/Gastric: Goal: Gastrointestinal status for postoperative course will improve Outcome: Progressing Goal: Occurrences of nausea will decrease Outcome: Progressing   Problem: Coping: Goal: Development of coping mechanisms to deal with changes in body function or appearance will improve Outcome: Progressing   Problem: Fluid Volume: Goal: Maintenance of adequate hydration will improve Outcome: Progressing   Problem: Nutritional: Goal: Nutritional status will improve Outcome: Progressing   Problem: Clinical Measurements: Goal: Will show no signs or symptoms of venous thromboembolism Outcome: Progressing Goal: Will remain free from infection Outcome: Progressing Goal: Will show no signs of GI Leak Outcome: Progressing   Problem: Respiratory: Goal: Will regain and/or maintain adequate ventilation Outcome: Progressing   Problem: Pain Management: Goal: Pain level will decrease Outcome: Progressing   Problem: Skin Integrity: Goal: Demonstration of wound healing without infection will improve Outcome: Progressing

## 2019-10-31 NOTE — Progress Notes (Signed)
Assessment unchanged. Verbalized understanding of dc instructions through teach back. Discharged via wc to front entrance accompanied by NT.

## 2019-10-31 NOTE — Discharge Summary (Signed)
Physician Discharge Summary  Patient ID: Rachel Gordon MRN: [id number] DOB/AGE: 1966/01/24 54 y.o.  PCP: Kendell Bane, NP  Admit date: 10/29/2019 Discharge date: 10/31/2019  Admission Diagnoses:  Morbid obesity  Discharge Diagnoses:  same  Active Problems:   S/P laparoscopic sleeve gastrectomy   Surgery:  Laparoscopic sleeve gastrectomy  Discharged Condition: improved  Hospital Course:   Had surgery .  Begun on liquids and advanced until ready for discharge  Consults: none  Significant Diagnostic Studies: none    Discharge Exam: Blood pressure (!) 155/86, pulse 64, temperature 98.6 F (37 C), temperature source Oral, resp. rate 16, height _0  (1.626 m), weight 128 kg, SpO2 99 %. Incisions ok  Disposition: Discharge disposition: 01-Home or Self Care       Discharge Instructions    Ambulate hourly while awake   Complete by: As directed    Call MD for:  difficulty breathing, headache or visual disturbances   Complete by: As directed    Call MD for:  persistant dizziness or light-headedness   Complete by: As directed    Call MD for:  persistant nausea and vomiting   Complete by: As directed    Call MD for:  redness, tenderness, or signs of infection (pain, swelling, redness, odor or green/yellow discharge around incision site)   Complete by: As directed    Call MD for:  severe uncontrolled pain   Complete by: As directed    Call MD for:  temperature >101 F   Complete by: As directed    Diet bariatric full liquid   Complete by: As directed    Incentive spirometry   Complete by: As directed    Perform hourly while awake     Allergies as of 10/31/2019   No Known Allergies     Medication List    TAKE these medications   CALCIUM PO Take 1 tablet by mouth 3 (three) times daily.   glucose blood test strip Commonly known as: ONE TOUCH ULTRA TEST Use as instructed   lisinopril-hydrochlorothiazide 20-12.5 MG tablet Commonly known as:  ZESTORETIC Take 1 tablet by mouth daily. Notes to patient: Monitor Blood Pressure Daily and keep a log for primary care physician.  Monitor for symptoms of dehydration.  You may need to make changes to your medications with rapid weight loss.     metFORMIN 500 MG 24 hr tablet Commonly known as: GLUCOPHAGE-XR Take 1,000 mg by mouth in the morning and at bedtime. Notes to patient: Monitor Blood Sugar Frequently and keep a log for primary care physician, you may need to adjust medication dosage with rapid weight loss.     naproxen sodium 220 MG tablet Commonly known as: ALEVE Take 440 mg by mouth daily as needed (pain). Notes to patient: Avoid NSAIDs for 6-8 weeks after surgery   norethindrone 5 MG tablet Commonly known as: AYGESTIN TAKE ONE TABLET DAILY   omeprazole 40 MG capsule Commonly known as: PRILOSEC Take 40 mg by mouth daily.   ondansetron 4 MG disintegrating tablet Commonly known as: ZOFRAN-ODT Take 1 tablet (4 mg total) by mouth every 6 (six) hours as needed for nausea or vomiting.   ONE TOUCH ULTRA 2 w/Device Kit 1 each by Does not apply route 2 (two) times daily.   onetouch ultrasoft lancets Use as instructed   oxyCODONE 5 MG immediate release tablet Commonly known as: Oxy IR/ROXICODONE Take 1 tablet (5 mg total) by mouth every 6 (six) hours as needed for severe pain.  pantoprazole 40 MG tablet Commonly known as: PROTONIX Take 1 tablet (40 mg total) by mouth daily.   Rybelsus 14 MG Tabs Generic drug: Semaglutide TAKE 1 TABLET BY MOUTH EVERY DAY What changed: how much to take   simvastatin 40 MG tablet Commonly known as: ZOCOR Take 40 mg by mouth at bedtime.   Vitamin D (Ergocalciferol) 1.25 MG (50000 UNIT) Caps capsule Commonly known as: DRISDOL TAKE 1 CAPSULE (50,000 UNITS TOTAL) BY MOUTH EVERY 7 (SEVEN) DAYS. What changed: when to take this      Follow-up Information    Surgery, [place]. Go on 11/21/2019.   Specialty: General Surgery Why:  at 10 am Contact information: [address] [place] [address] [phone]        Carlena Hurl, PA-C. Go on 12/20/2019.   Specialty: General Surgery Why: at 9 am Contact information: [place] [address] [phone]           Signed: Pedro Earls 10/31/2019, 9:34 AM

## 2019-10-31 NOTE — Progress Notes (Signed)
Patient alert and oriented, Post op day 2.  Provided support and encouragement.  Encouraged pulmonary toilet, ambulation and small sips of liquids.  All questions answered.  Will continue to monitor. 

## 2019-11-04 ENCOUNTER — Telehealth (HOSPITAL_COMMUNITY): Payer: Self-pay

## 2019-11-04 NOTE — Telephone Encounter (Addendum)
Patient called to discuss post bariatric surgery follow up questions.  Voicemail left for patient with contact information provided for return call await return call.   1.  Tell me about your pain and pain management?denies  2.  Let's talk about fluid intake.  How much total fluid are you taking in?40 ounces  3.  How much protein have you taken in the last 2 days?60 gram  4.  Have you had nausea?  Tell me about when have experienced nausea and what you did to help?denies  5.  Has the frequency or color changed with your urine?urine light in color   6.  Tell me what your incisions look like?no problems itching   7.  Have you been passing gas? BM?had BM since discharge  8.  If a problem or question were to arise who would you call?  Do you know contact numbers for [facility], [facility], and [facility]?aware of how to contact all services  9.  How has the walking going?walking regularly without any difficulty  10.  How are your vitamins and calcium going?  How are you taking them?mvi and calcium started

## 2019-11-12 ENCOUNTER — Ambulatory Visit: Payer: 59

## 2019-11-19 ENCOUNTER — Encounter: Payer: 59 | Attending: Surgery | Admitting: Skilled Nursing Facility1

## 2019-11-19 ENCOUNTER — Other Ambulatory Visit: Payer: Self-pay

## 2019-11-19 DIAGNOSIS — Z6841 Body Mass Index (BMI) 40.0 and over, adult: Secondary | ICD-10-CM | POA: Insufficient documentation

## 2019-11-20 NOTE — Progress Notes (Signed)
2 Week Post-Operative Nutrition Class   Patient was seen on 09/11/18 for Post-Operative Nutrition education at the [facility].    Surgery date: 10/29/2019 Surgery type: sleeve Start weight at [facility]: 297 Weight today: 263.8   Body Composition Scale 11/20/2019  Total Body Fat % 47.1  Visceral Fat 18  Fat-Free Mass % 52.8   Total Body Water % 40.9   Muscle-Mass lbs 31.1  Body Fat Displacement          Torso  lbs 77.2         Left Leg  lbs 15.4         Right Leg  lbs 15.4         Left Arm  lbs 7.7         Right Arm   lbs 7.7     The following the learning objectives were met by the patient during this course:  Identifies Phase 3 (Soft, High Proteins) Dietary Goals and will begin from 2 weeks post-operatively to 2 months post-operatively  Identifies appropriate sources of fluids and proteins   States protein recommendations and appropriate sources post-operatively  Identifies the need for appropriate texture modifications, mastication, and bite sizes when consuming solids  Identifies appropriate multivitamin and calcium sources post-operatively  Describes the need for physical activity post-operatively and will follow MD recommendations  States when to call healthcare provider regarding medication questions or post-operative complications   Handouts given during class include:  Phase 3A: Soft, High Protein Diet Handout   Follow-Up Plan: Patient will follow-up at [facility] in 6 weeks for 2 month post-op nutrition visit for diet advancement per MD.

## 2019-11-30 ENCOUNTER — Other Ambulatory Visit: Payer: Self-pay | Admitting: Adult Health

## 2019-11-30 DIAGNOSIS — E1165 Type 2 diabetes mellitus with hyperglycemia: Secondary | ICD-10-CM

## 2019-12-04 ENCOUNTER — Other Ambulatory Visit: Payer: Self-pay

## 2019-12-04 DIAGNOSIS — E1165 Type 2 diabetes mellitus with hyperglycemia: Secondary | ICD-10-CM

## 2019-12-04 MED ORDER — GLUCOSE BLOOD VI STRP: ORAL_STRIP | 12 refills | Status: DC

## 2019-12-09 ENCOUNTER — Other Ambulatory Visit: Payer: Self-pay | Admitting: Obstetrics and Gynecology

## 2019-12-09 NOTE — Telephone Encounter (Signed)
Advise

## 2019-12-30 ENCOUNTER — Other Ambulatory Visit: Payer: Self-pay | Admitting: Adult Health

## 2020-01-02 ENCOUNTER — Other Ambulatory Visit: Payer: Self-pay

## 2020-01-02 ENCOUNTER — Encounter: Payer: 59 | Attending: Surgery | Admitting: Skilled Nursing Facility1

## 2020-01-02 DIAGNOSIS — Z713 Dietary counseling and surveillance: Secondary | ICD-10-CM | POA: Insufficient documentation

## 2020-01-02 DIAGNOSIS — Z6841 Body Mass Index (BMI) 40.0 and over, adult: Secondary | ICD-10-CM | POA: Diagnosis present

## 2020-01-02 NOTE — Progress Notes (Signed)
Bariatric Nutrition Follow-Up Visit Medical Nutrition Therapy   Pt given star previously: No  NUTRITION ASSESSMENT    Anthropometrics  Surgery date: 10/29/2019 Surgery type: sleeve Start weight at [facility]: 297 Weight today: 248.8   Body Composition Scale 11/20/2019 01/02/2020  Total Body Fat % 47.1 45.8  Visceral Fat 18 17  Fat-Free Mass % 52.8 54.1   Total Body Water % 40.9 41.5   Muscle-Mass lbs 31.1 31  Body Fat Displacement           Torso  lbs 77.2 70.7         Left Leg  lbs 15.4 14.1         Right Leg  lbs 15.4 14.1         Left Arm  lbs 7.7 7         Right Arm   lbs 7.7 7    Clinical  Medical hx: PCOS, diabetes, hypertension, hypercholesterolemia, sleep apnea  Medications:  metformin, lisinopril, simvastatin, bydureon, norethindrone  Labs:    Lifestyle & Dietary Hx  Pt states she cannot tolerate shrimp. Pt states she does not like non starchy vegetables but states he is hopeful she will now. Pt states she has not been checking her blood sugars due to being out of strips.   Estimated daily fluid intake: 64 oz Estimated daily protein intake: 80 +g Supplements: multi and calcium  Current average weekly physical activity: ADL's  24-Hr Dietary Recall First Meal: protein shake Snack: cheese Second Meal: deli meat or chicken  Snack: pork rinds Third Meal: chicken  Snack:  Beverages: 64 ounces water + flavoring, water water    Post-Op Goals/ Signs/ Symptoms Using straws: no Drinking while eating: no Chewing/swallowing difficulties: no Changes in vision: no Changes to mood/headaches: no Hair loss/changes to skin/nails: no Difficulty focusing/concentrating: no Sweating: no Dizziness/lightheadedness: no Palpitations: no  Carbonated/caffeinated beverages: no N/V/D/C/Gas: no Abdominal pain: no Dumping syndrome: no    NUTRITION DIAGNOSIS  Overweight/obesity ([place]-3.3) related to past poor dietary habits and physical inactivity as evidenced by completed  bariatric surgery and following dietary guidelines for continued weight loss and healthy nutrition status.     NUTRITION INTERVENTION Nutrition counseling (C-1) and education (E-2) to facilitate bariatric surgery goals, including: . Diet advancement to the next phase (phase 4) now including non starchy vegetables  . The importance of consuming adequate calories as well as certain nutrients daily due to the body's need for essential vitamins, minerals, and fats . The importance of daily physical activity and to reach a goal of at least 150 minutes of moderate to vigorous physical activity weekly (or as directed by their physician) due to benefits such as increased musculature and improved lab values . The importance of intuitive eating specifically learning hunger-satiety cues and understanding the importance of learning a new body  Goals: -Continue to aim for a minimum of 64 fluid ounces 7 days a week with at least 30 ounces being plain water -Eat non-starchy vegetables 2 times a day 7 days a week -Start out with soft cooked vegetables today and tomorrow; if tolerated begin to eat raw vegetables or cooked including salads -Eat your 3 ounces of protein first then start in on your non-starchy vegetables; once you understand how much of your meal leads to satisfaction and not full while still eating 3 ounces of protein and non-starchy vegetables you can eat them in any order  -Continue to aim for 30 minutes of activity at least 5 times a week -Do  NOT cook with/add to your food: alfredo sauce, cheese sauce, barbeque sauce, ketchup, fat back, butter, bacon grease, grease, Crisco, OR SUGAR   Handouts Provided Include   Phase 4   Learning Style & Readiness for Change Teaching method utilized: Visual & Auditory  Demonstrated degree of understanding via: Teach Back  Barriers to learning/adherence to lifestyle change: none identified   RD's Notes for Next Visit . Assess adherence to pt chosen  goals   MONITORING & EVALUATION Dietary intake, weekly physical activity, body weight  Next Steps Patient is to follow-up in 3 months

## 2020-01-14 ENCOUNTER — Telehealth: Payer: Self-pay

## 2020-01-14 NOTE — Telephone Encounter (Signed)
Confirmed appointment on 01/16/2020 and screened for covid. klh 

## 2020-01-16 ENCOUNTER — Other Ambulatory Visit: Payer: Self-pay

## 2020-01-16 ENCOUNTER — Ambulatory Visit: Payer: 59 | Admitting: Adult Health

## 2020-01-16 ENCOUNTER — Encounter: Payer: Self-pay | Admitting: Adult Health

## 2020-01-16 VITALS — BP 137/69 | HR 74 | Temp 97.6°F | Resp 16 | Ht 64.0 in | Wt 242.2 lb

## 2020-01-16 DIAGNOSIS — E785 Hyperlipidemia, unspecified: Secondary | ICD-10-CM | POA: Diagnosis not present

## 2020-01-16 DIAGNOSIS — G4733 Obstructive sleep apnea (adult) (pediatric): Secondary | ICD-10-CM | POA: Diagnosis not present

## 2020-01-16 DIAGNOSIS — Z9989 Dependence on other enabling machines and devices: Secondary | ICD-10-CM

## 2020-01-16 DIAGNOSIS — E1165 Type 2 diabetes mellitus with hyperglycemia: Secondary | ICD-10-CM

## 2020-01-16 DIAGNOSIS — I10 Essential (primary) hypertension: Secondary | ICD-10-CM | POA: Diagnosis not present

## 2020-01-16 LAB — POCT GLYCOSYLATED HEMOGLOBIN (HGB A1C): Hemoglobin A1C: 5.3 % (ref 4.0–5.6)

## 2020-01-16 NOTE — Progress Notes (Signed)
[facility] [place], [place] [address]  Internal MEDICINE  Office Visit Note  Patient Name: Rachel Gordon  [id number]  [id number]  Date of Service: 01/16/2020  Chief Complaint  Patient presents with  . Follow-up  . Diabetes  . Gastroesophageal Reflux  . Hyperlipidemia  . Hypertension    HPI  Pt is here for follow up on DM, GERD, HTN and HLD.  She had gastric bypass surgery on April 13th. She has lost 40 pounds since surgery day, and has lost 75 pounds total since she has been our patient.  She is doing very well and does not have any complaints.  She has had some issues with constipation, but is able to manage with miralax.     Current Medication: Outpatient Encounter Medications as of 01/16/2020  Medication Sig  . Blood Glucose Monitoring Suppl (ONE TOUCH ULTRA 2) w/Device KIT 1 EACH BY DOES NOT APPLY ROUTE 2 (TWO) TIMES DAILY.  Marland Kitchen CALCIUM PO Take 1 tablet by mouth 3 (three) times daily.  Marland Kitchen glucose blood (ONE TOUCH ULTRA TEST) test strip Use as instructed  . Lancets (ONETOUCH ULTRASOFT) lancets Use as instructed  . lisinopril-hydrochlorothiazide (ZESTORETIC) 20-12.5 MG tablet Take 1 tablet by mouth daily.  . metFORMIN (GLUCOPHAGE-XR) 500 MG 24 hr tablet Take 1,000 mg by mouth in the morning and at bedtime.  . naproxen sodium (ALEVE) 220 MG tablet Take 440 mg by mouth daily as needed (pain).  . norethindrone (AYGESTIN) 5 MG tablet TAKE ONE TABLET DAILY  . omeprazole (PRILOSEC) 40 MG capsule Take 40 mg by mouth daily.   . ondansetron (ZOFRAN-ODT) 4 MG disintegrating tablet Take 1 tablet (4 mg total) by mouth every 6 (six) hours as needed for nausea or vomiting.  Marland Kitchen oxyCODONE (OXY IR/ROXICODONE) 5 MG immediate release tablet Take 1 tablet (5 mg total) by mouth every 6 (six) hours as needed for severe pain.  . pantoprazole (PROTONIX) 40 MG tablet Take 1 tablet (40 mg total) by mouth daily.  . RYBELSUS 14 MG TABS TAKE 1 TABLET BY MOUTH EVERY DAY (Patient  taking differently: Take 14 mg by mouth daily. )  . simvastatin (ZOCOR) 40 MG tablet Take 40 mg by mouth at bedtime.   . Vitamin D, Ergocalciferol, (DRISDOL) 1.25 MG (50000 UNIT) CAPS capsule TAKE 1 CAPSULE (50,000 UNITS TOTAL) BY MOUTH EVERY 7 (SEVEN) DAYS.   No facility-administered encounter medications on file as of 01/16/2020.    Surgical History: Past Surgical History:  Procedure Laterality Date  . COLONOSCOPY    . DILATATION & CURETTAGE/HYSTEROSCOPY WITH MYOSURE N/A 11/28/2014   Procedure: DILATATION & CURETTAGE/HYSTEROSCOPY WITH MYOSURE;  Surgeon: Will Bonnet, MD;  Location: [facility] ORS;  Service: Gynecology;  Laterality: N/A;  . DILATION AND CURETTAGE OF UTERUS    . LAPAROSCOPIC GASTRIC SLEEVE RESECTION N/A 10/29/2019   Procedure: LAPAROSCOPIC GASTRIC SLEEVE RESECTION, Upper Endo, ERAS Pathway;  Surgeon: Johnathan Hausen, MD;  Location: [facility] ORS;  Service: General;  Laterality: N/A;  . NOVASURE ABLATION    . TONSILLECTOMY      Medical History: Past Medical History:  Diagnosis Date  . Abnormal uterine bleeding (AUB)   . Anemia   . Chronic kidney disease   . DM (diabetes mellitus) ([place])   . Fibroid   . GERD (gastroesophageal reflux disease)   . Headache    h/o  . Hyperlipidemia   . Hyperlipidemia   . Hypertension   . Metrorrhagia   . Obesity    bmi 54  .  PCOS (polycystic ovarian syndrome)   . S/P endometrial ablation   . Vaginal Pap smear, abnormal 2009   pos hvp    Family History: Family History  Problem Relation Age of Onset  . Colon cancer Mother 69  . Colon cancer Father 3  . Breast cancer Maternal Grandmother 57    Social History   Socioeconomic History  . Marital status: Single    Spouse name: Not on file  . Number of children: Not on file  . Years of education: Not on file  . Highest education level: Not on file  Occupational History  . Not on file  Tobacco Use  . Smoking status: Never Smoker  . Smokeless tobacco: Never Used  Vaping Use  .  Vaping Use: Never used  Substance and Sexual Activity  . Alcohol use: Not Currently    Comment: socially  . Drug use: No  . Sexual activity: Yes    Partners: Male    Birth control/protection: None  Other Topics Concern  . Not on file  Social History Narrative  . Not on file   Social Determinants of Health   Financial Resource Strain:   . Difficulty of Paying Living Expenses:   Food Insecurity:   . Worried About Charity fundraiser in the Last Year:   . Arboriculturist in the Last Year:   Transportation Needs:   . Film/video editor (Medical):   Marland Kitchen Lack of Transportation (Non-Medical):   Physical Activity:   . Days of Exercise per Week:   . Minutes of Exercise per Session:   Stress:   . Feeling of Stress :   Social Connections:   . Frequency of Communication with Friends and Family:   . Frequency of Social Gatherings with Friends and Family:   . Attends Religious Services:   . Active Member of Clubs or Organizations:   . Attends Archivist Meetings:   Marland Kitchen Marital Status:   Intimate Partner Violence:   . Fear of Current or Ex-Partner:   . Emotionally Abused:   Marland Kitchen Physically Abused:   . Sexually Abused:       Review of Systems  Constitutional: Negative for chills, fatigue and unexpected weight change.  HENT: Negative for congestion, rhinorrhea, sneezing and sore throat.   Eyes: Negative for photophobia, pain and redness.  Respiratory: Negative for cough, chest tightness and shortness of breath.   Cardiovascular: Negative for chest pain and palpitations.  Gastrointestinal: Negative for abdominal pain, constipation, diarrhea, nausea and vomiting.  Endocrine: Negative.   Genitourinary: Negative for dysuria and frequency.  Musculoskeletal: Negative for arthralgias, back pain, joint swelling and neck pain.  Skin: Negative for rash.  Allergic/Immunologic: Negative.   Neurological: Negative for tremors and numbness.  Hematological: Negative for adenopathy.  Does not bruise/bleed easily.  Psychiatric/Behavioral: Negative for behavioral problems and sleep disturbance. The patient is not nervous/anxious.     Vital Signs: BP 137/69   Pulse 74   Temp 97.6 F (36.4 C)   Resp 16   Ht '5\' 4"'  (1.626 m)   Wt 242 lb 3.2 oz (109.9 kg)   SpO2 100%   BMI 41.57 kg/m    Physical Exam Vitals and nursing note reviewed.  Constitutional:      General: She is not in acute distress.    Appearance: She is well-developed. She is not diaphoretic.  HENT:     Head: Normocephalic and atraumatic.     Mouth/Throat:     Pharynx: No  oropharyngeal exudate.  Eyes:     Pupils: Pupils are equal, round, and reactive to light.  Neck:     Thyroid: No thyromegaly.     Vascular: No JVD.     Trachea: No tracheal deviation.  Cardiovascular:     Rate and Rhythm: Normal rate and regular rhythm.     Heart sounds: Normal heart sounds. No murmur heard.  No friction rub. No gallop.   Pulmonary:     Effort: Pulmonary effort is normal. No respiratory distress.     Breath sounds: Normal breath sounds. No wheezing or rales.  Chest:     Chest wall: No tenderness.  Abdominal:     Palpations: Abdomen is soft.     Tenderness: There is no abdominal tenderness. There is no guarding.  Musculoskeletal:        General: Normal range of motion.     Cervical back: Normal range of motion and neck supple.  Lymphadenopathy:     Cervical: No cervical adenopathy.  Skin:    General: Skin is warm and dry.  Neurological:     Mental Status: She is alert and oriented to person, place, and time.     Cranial Nerves: No cranial nerve deficit.  Psychiatric:        Behavior: Behavior normal.        Thought Content: Thought content normal.        Judgment: Judgment normal.    Assessment/Plan: 1. Uncontrolled type 2 diabetes mellitus with hyperglycemia (HCC) A1C is 5.3 today. She is well controlled.  She is currently taking Rybelsus - POCT HgB A1C  2. Hypertension, unspecified  type Controlled, continue present management.   3. Hyperlipidemia, unspecified hyperlipidemia type Pt is not currently taking simvastatin due to bypass surgery.  Will recheck Lipid panel in 6 months and reassess.   4. OSA on CPAP Stable, continue to use as discussed.   General Counseling: Frankee verbalizes understanding of the findings of todays visit and agrees with plan of treatment. I have discussed any further diagnostic evaluation that may be needed or ordered today. We also reviewed her medications today. she has been encouraged to call the office with any questions or concerns that should arise related to todays visit.    Orders Placed This Encounter  Procedures  . POCT HgB A1C    No orders of the defined types were placed in this encounter.   Time spent: 30 Minutes   This patient was seen by Orson Gear AGNP-C in Collaboration with Dr Lavera Guise as a part of collaborative care agreement     Kendell Bane AGNP-C Internal medicine

## 2020-02-25 ENCOUNTER — Other Ambulatory Visit: Payer: Self-pay | Admitting: Adult Health

## 2020-02-27 ENCOUNTER — Other Ambulatory Visit: Payer: Self-pay

## 2020-02-27 ENCOUNTER — Encounter: Payer: Self-pay | Admitting: Obstetrics and Gynecology

## 2020-02-27 ENCOUNTER — Ambulatory Visit (INDEPENDENT_AMBULATORY_CARE_PROVIDER_SITE_OTHER): Payer: 59 | Admitting: Obstetrics and Gynecology

## 2020-02-27 VITALS — BP 111/80 | HR 80 | Ht 64.0 in | Wt 258.0 lb

## 2020-02-27 DIAGNOSIS — Z30011 Encounter for initial prescription of contraceptive pills: Secondary | ICD-10-CM

## 2020-02-27 MED ORDER — NORETHINDRONE 0.35 MG PO TABS: 1.0000 | ORAL_TABLET | Freq: Every day | ORAL | 4 refills | Status: DC

## 2020-02-27 NOTE — Progress Notes (Signed)
Obstetrics & Gynecology Office Visit   Chief Complaint  Patient presents with  . Vaginal Bleeding    gastric sleeve in 10/2019, cycles have started since surgery    History of Present Illness: 54 y.o. G0P0000 female who presents with return of menses after having a gastric sleeve surgery in April.  She is concerned she might become pregnant.  She would like birth control.  Her periods have been coming regularly since April. They last 7 days. The periods are heavy in the beginning and she passes clots.  She states they are not really painful.  She does not have intermenstrual bleeding.  Her LMP was this morning.    Past Medical History:  Diagnosis Date  . Abnormal uterine bleeding (AUB)   . Anemia   . Chronic kidney disease   . DM (diabetes mellitus) ([place])   . Fibroid   . GERD (gastroesophageal reflux disease)   . Headache    h/o  . Hyperlipidemia   . Hyperlipidemia   . Hypertension   . Metrorrhagia   . Obesity    bmi 54  . PCOS (polycystic ovarian syndrome)   . S/P endometrial ablation   . Vaginal Pap smear, abnormal 2009   pos hvp    Past Surgical History:  Procedure Laterality Date  . COLONOSCOPY    . DILATATION & CURETTAGE/HYSTEROSCOPY WITH MYOSURE N/A 11/28/2014   Procedure: DILATATION & CURETTAGE/HYSTEROSCOPY WITH MYOSURE;  Surgeon: Will Bonnet, MD;  Location: [facility];  Service: Gynecology;  Laterality: N/A;  . DILATION AND CURETTAGE OF UTERUS    . LAPAROSCOPIC GASTRIC SLEEVE RESECTION N/A 10/29/2019   Procedure: LAPAROSCOPIC GASTRIC SLEEVE RESECTION, Upper Endo, ERAS Pathway;  Surgeon: Johnathan Hausen, MD;  Location: [facility];  Service: General;  Laterality: N/A;  . NOVASURE ABLATION    . TONSILLECTOMY      Gynecologic History: No LMP recorded. Patient has had an ablation.  Obstetric History: G0P0000  Family History  Problem Relation Age of Onset  . Colon cancer Mother 11  . Colon cancer Father 71  . Breast cancer Maternal Grandmother 21    Social  History   Socioeconomic History  . Marital status: Single    Spouse name: Not on file  . Number of children: Not on file  . Years of education: Not on file  . Highest education level: Not on file  Occupational History  . Not on file  Tobacco Use  . Smoking status: Never Smoker  . Smokeless tobacco: Never Used  Vaping Use  . Vaping Use: Never used  Substance and Sexual Activity  . Alcohol use: Not Currently    Comment: socially  . Drug use: No  . Sexual activity: Yes    Partners: Male    Birth control/protection: None  Other Topics Concern  . Not on file  Social History Narrative  . Not on file   Social Determinants of Health   Financial Resource Strain:   . Difficulty of Paying Living Expenses:   Food Insecurity:   . Worried About Charity fundraiser in the Last Year:   . Arboriculturist in the Last Year:   Transportation Needs:   . Film/video editor (Medical):   Marland Kitchen Lack of Transportation (Non-Medical):   Physical Activity:   . Days of Exercise per Week:   . Minutes of Exercise per Session:   Stress:   . Feeling of Stress :   Social Connections:   . Frequency of Communication with Friends  and Family:   . Frequency of Social Gatherings with Friends and Family:   . Attends Religious Services:   . Active Member of Clubs or Organizations:   . Attends Archivist Meetings:   Marland Kitchen Marital Status:   Intimate Partner Violence:   . Fear of Current or Ex-Partner:   . Emotionally Abused:   Marland Kitchen Physically Abused:   . Sexually Abused:     No Known Allergies  Prior to Admission medications   Medication Sig Start Date End Date Taking? Authorizing Provider  CALCIUM PO Take 1 tablet by mouth 3 (three) times daily.   Yes [provider]  lisinopril-hydrochlorothiazide (ZESTORETIC) 20-12.5 MG tablet TAKE 1 TABLET BY MOUTH EVERY DAY 02/25/20  Yes Scarboro, Audie Clear, NP  RYBELSUS 14 MG TABS TAKE 1 TABLET BY MOUTH EVERY DAY Patient taking differently: Take 14 mg  by mouth daily.  10/01/19  Yes Scarboro, Audie Clear, NP  Vitamin D, Ergocalciferol, (DRISDOL) 1.25 MG (50000 UNIT) CAPS capsule TAKE 1 CAPSULE (50,000 UNITS TOTAL) BY MOUTH EVERY 7 (SEVEN) DAYS. 12/30/19  Yes Kendell Bane, NP    Review of Systems  Constitutional: Negative.   HENT: Negative.   Eyes: Negative.   Respiratory: Negative.   Cardiovascular: Negative.   Gastrointestinal: Negative.   Genitourinary: Negative.   Musculoskeletal: Negative.   Skin: Negative.   Neurological: Negative.   Psychiatric/Behavioral: Negative.      Physical Exam BP 111/80   Pulse 80   Ht 5\' 4"  (1.626 m)   Wt 258 lb (117 kg)   BMI 44.29 kg/m  No LMP recorded. Patient has had an ablation. Physical Exam Constitutional:      General: She is not in acute distress.    Appearance: Normal appearance.  HENT:     Head: Normocephalic and atraumatic.  Eyes:     General: No scleral icterus.    Conjunctiva/sclera: Conjunctivae normal.  Neurological:     General: No focal deficit present.     Mental Status: She is alert and oriented to person, place, and time.     Cranial Nerves: No cranial nerve deficit.  Psychiatric:        Mood and Affect: Mood normal.        Behavior: Behavior normal.        Judgment: Judgment normal.     Female chaperone present for pelvic and breast  portions of the physical exam  Assessment: 54 y.o. G0P0000 female here for  1. Encounter for initial prescription of contraceptive pills      Plan: Problem List Items Addressed This Visit    None    Visit Diagnoses    Encounter for initial prescription of contraceptive pills    -  Primary   Relevant Medications   norethindrone (MICRONOR) 0.35 MG tablet     Reviewed all forms of birth control options available including abstinence; over the counter/barrier methods; hormonal contraceptive medication including pill, patch, ring, injection,contraceptive implant; hormonal and nonhormonal IUDs; permanent sterilization options  including vasectomy and the various tubal sterilization modalities. Risks and benefits reviewed.  Questions were answered.  Given her medical history, she is somewhat limited in types of contraception she can have.  We spent more time on POPs, Nexplanon, surgery, and condoms. She is most interested in POPs. Discussed abnormal bleeding pattern, how to take, failure rate.  Prescription provided.   A total of 25 minutes were spent face-to-face with the patient as well as preparation, review, communication, and documentation during this encounter.  Prentice Docker, MD 02/27/2020 8:44 AM

## 2020-03-17 ENCOUNTER — Other Ambulatory Visit: Payer: Self-pay | Admitting: Adult Health

## 2020-03-27 ENCOUNTER — Other Ambulatory Visit: Payer: Self-pay | Admitting: Adult Health

## 2020-03-27 DIAGNOSIS — E1165 Type 2 diabetes mellitus with hyperglycemia: Secondary | ICD-10-CM

## 2020-03-31 ENCOUNTER — Other Ambulatory Visit: Payer: Self-pay

## 2020-03-31 ENCOUNTER — Encounter: Payer: 59 | Attending: Surgery | Admitting: Skilled Nursing Facility1

## 2020-03-31 DIAGNOSIS — Z6841 Body Mass Index (BMI) 40.0 and over, adult: Secondary | ICD-10-CM | POA: Insufficient documentation

## 2020-04-02 NOTE — Progress Notes (Signed)
Follow-up visit:  Post-Operative sleeve Surgery  Medical Nutrition Therapy:  Appt start time: 6:00pm end time:  7:00pm  Primary concerns today: Post-operative Bariatric Surgery Nutrition Management 6 Month Post-Op Class  04/13-sleeve   Body Composition Scale Date  Weight  lbs 224.6  Total Body Fat  % 43.4     Visceral Fat 15  Fat-Free Mass  % 56.5     Total Body Water  % 42.7     Muscle-Mass  lbs 30.6  BMI 38.4  Body Fat Displacement ---        Torso  lbs 60.4        Left Leg  lbs 12        Right Leg  lbs 12        Left Arm  lbs 6        Right Arm  lbs 6     Information Reviewed/ Discussed During Appointment: -Review of composition scale numbers -Fluid requirements (64-100 ounces) -Protein requirements (60-80g) -Strategies for tolerating diet -Advancement of diet to include Starchy vegetables -Barriers to inclusion of new foods -Inclusion of appropriate multivitamin and calcium supplements  -Exercise recommendations   Fluid intake: adequate   Medications: See List Supplementation: appropriate    Using straws: no Drinking while eating: no Having you been chewing well: yes Chewing/swallowing difficulties: no Changes in vision: no Changes to mood/headaches: no Hair loss/Cahnges to skin/Changes to nails: no Any difficulty focusing or concentrating: no Sweating: no Dizziness/Lightheaded: no Palpitations: no  Carbonated beverages: no N/V/D/C/GAS: no Abdominal Pain: no Dumping syndrome: no  Recent physical activity:  ADL's  Progress Towards Goal(s):  In Progress  Handouts given during visit include:  Phase V diet Progression   Goals Sheet  The Benefits of Exercise are endless.....  Support Group Topics  Pt Chosen Goals: Nutrition Goals:  I will drink 64 ounces of any sugar free, carbonation free option 7 days a week by (specific date) __october______  Physical Activity Goals: I will (type of movement) ________walk_________________ for  (hours/minutes) ____45______, for (how many days a week) ____________5______________ by (specific date) ______oct__________  Teaching Method Utilized:  Visual Auditory Hands on  Demonstrated degree of understanding via:  Teach Back   Monitoring/Evaluation:  Dietary intake, exercise, and body weight. Follow up in 3 months for 9 month post-op visit.

## 2020-04-16 ENCOUNTER — Encounter: Payer: Self-pay | Admitting: Hospice and Palliative Medicine

## 2020-04-16 ENCOUNTER — Ambulatory Visit: Payer: 59 | Admitting: Hospice and Palliative Medicine

## 2020-04-16 ENCOUNTER — Other Ambulatory Visit: Payer: Self-pay

## 2020-04-16 DIAGNOSIS — Z0001 Encounter for general adult medical examination with abnormal findings: Secondary | ICD-10-CM

## 2020-04-16 DIAGNOSIS — Z6838 Body mass index (BMI) 38.0-38.9, adult: Secondary | ICD-10-CM

## 2020-04-16 DIAGNOSIS — E1165 Type 2 diabetes mellitus with hyperglycemia: Secondary | ICD-10-CM | POA: Diagnosis not present

## 2020-04-16 DIAGNOSIS — Z23 Encounter for immunization: Secondary | ICD-10-CM

## 2020-04-16 DIAGNOSIS — I10 Essential (primary) hypertension: Secondary | ICD-10-CM | POA: Diagnosis not present

## 2020-04-16 LAB — POCT GLYCOSYLATED HEMOGLOBIN (HGB A1C): Hemoglobin A1C: 5.1 % (ref 4.0–5.6)

## 2020-04-16 MED ORDER — LISINOPRIL 10 MG PO TABS: 10.0000 mg | ORAL_TABLET | Freq: Every day | ORAL | 3 refills | Status: DC

## 2020-04-16 MED ORDER — RYBELSUS 7 MG PO TABS: 7.0000 mg | ORAL_TABLET | Freq: Every day | ORAL | 1 refills | Status: DC

## 2020-04-16 NOTE — Progress Notes (Signed)
[facility] [place], [place] [address]  Internal MEDICINE  Office Visit Note  Patient Name: Rachel Gordon  [id number]  [id number]  Date of Service: 04/17/2020  Chief Complaint  Patient presents with  . Follow-up  . Diabetes  . Hyperlipidemia  . Hypertension  . Quality Metric Gaps    HepC, pneumovax, TDAP, eye exam    HPI Patient is here for routine follow-up S/p gastric sleeve April 13th, continues to have successful weight loss, no complications since procedure We have been slowly tapering down her DM and HTN medications as she continues on her weight loss journey Issues with constipation have improved  Current Medication: Outpatient Encounter Medications as of 04/16/2020  Medication Sig  . CALCIUM PO Take 1 tablet by mouth 3 (three) times daily.  . norethindrone (MICRONOR) 0.35 MG tablet Take 1 tablet (0.35 mg total) by mouth daily.  . RYBELSUS 14 MG TABS TAKE 1 TABLET BY MOUTH EVERY DAY  . Vitamin D, Ergocalciferol, (DRISDOL) 1.25 MG (50000 UNIT) CAPS capsule TAKE 1 CAPSULE (50,000 UNITS TOTAL) BY MOUTH EVERY 7 (SEVEN) DAYS.  . [DISCONTINUED] lisinopril-hydrochlorothiazide (ZESTORETIC) 20-12.5 MG tablet TAKE 1 TABLET BY MOUTH EVERY DAY  . lisinopril (ZESTRIL) 10 MG tablet Take 1 tablet (10 mg total) by mouth daily.  . Semaglutide (RYBELSUS) 7 MG TABS Take 7 mg by mouth daily.   No facility-administered encounter medications on file as of 04/16/2020.    Surgical History: Past Surgical History:  Procedure Laterality Date  . COLONOSCOPY    . DILATATION & CURETTAGE/HYSTEROSCOPY WITH MYOSURE N/A 11/28/2014   Procedure: DILATATION & CURETTAGE/HYSTEROSCOPY WITH MYOSURE;  Surgeon: Will Bonnet, MD;  Location: [facility];  Service: Gynecology;  Laterality: N/A;  . DILATION AND CURETTAGE OF UTERUS    . LAPAROSCOPIC GASTRIC SLEEVE RESECTION N/A 10/29/2019   Procedure: LAPAROSCOPIC GASTRIC SLEEVE RESECTION, Upper Endo, ERAS Pathway;  Surgeon:  Johnathan Hausen, MD;  Location: [facility];  Service: General;  Laterality: N/A;  . NOVASURE ABLATION    . TONSILLECTOMY      Medical History: Past Medical History:  Diagnosis Date  . Abnormal uterine bleeding (AUB)   . Anemia   . Chronic kidney disease   . DM (diabetes mellitus) ()   . Fibroid   . GERD (gastroesophageal reflux disease)   . Headache    h/o  . Hyperlipidemia   . Hyperlipidemia   . Hypertension   . Metrorrhagia   . Obesity    bmi 54  . PCOS (polycystic ovarian syndrome)   . S/P endometrial ablation   . Vaginal Pap smear, abnormal 2009   pos hvp    Family History: Family History  Problem Relation Age of Onset  . Colon cancer Mother 68  . Colon cancer Father 64  . Breast cancer Maternal Grandmother 90    Social History   Socioeconomic History  . Marital status: Single    Spouse name: Not on file  . Number of children: Not on file  . Years of education: Not on file  . Highest education level: Not on file  Occupational History  . Not on file  Tobacco Use  . Smoking status: Never Smoker  . Smokeless tobacco: Never Used  Vaping Use  . Vaping Use: Never used  Substance and Sexual Activity  . Alcohol use: Not Currently    Comment: socially  . Drug use: No  . Sexual activity: Yes    Partners: Male    Birth control/protection: None  Other Topics Concern  . Not on file  Social History Narrative  . Not on file   Social Determinants of Health   Financial Resource Strain:   . Difficulty of Paying Living Expenses: Not on file  Food Insecurity:   . Worried About Charity fundraiser in the Last Year: Not on file  . Ran Out of Food in the Last Year: Not on file  Transportation Needs:   . Lack of Transportation (Medical): Not on file  . Lack of Transportation (Non-Medical): Not on file  Physical Activity:   . Days of Exercise per Week: Not on file  . Minutes of Exercise per Session: Not on file  Stress:   . Feeling of Stress : Not on file   Social Connections:   . Frequency of Communication with Friends and Family: Not on file  . Frequency of Social Gatherings with Friends and Family: Not on file  . Attends Religious Services: Not on file  . Active Member of Clubs or Organizations: Not on file  . Attends Archivist Meetings: Not on file  . Marital Status: Not on file  Intimate Partner Violence:   . Fear of Current or Ex-Partner: Not on file  . Emotionally Abused: Not on file  . Physically Abused: Not on file  . Sexually Abused: Not on file    Review of Systems  Constitutional: Negative for chills, diaphoresis and fatigue.  HENT: Negative for ear pain, postnasal drip and sinus pressure.   Eyes: Negative for photophobia, discharge, redness, itching and visual disturbance.  Respiratory: Negative for cough, shortness of breath and wheezing.   Cardiovascular: Negative for chest pain, palpitations and leg swelling.  Gastrointestinal: Negative for abdominal pain, constipation, diarrhea, nausea and vomiting.  Genitourinary: Negative for dysuria and flank pain.  Musculoskeletal: Negative for arthralgias, back pain, gait problem and neck pain.  Skin: Negative for color change and rash.  Allergic/Immunologic: Negative for environmental allergies and food allergies.  Neurological: Negative for dizziness and headaches.  Hematological: Does not bruise/bleed easily.  Psychiatric/Behavioral: Negative for agitation, behavioral problems (depression) and hallucinations.    Vital Signs: BP 98/62   Pulse 67   Temp (!) 97.3 F (36.3 C)   Resp 16   Ht 5\' 4"  (1.626 m)   Wt 222 lb 12.8 oz (101.1 kg)   SpO2 100%   BMI 38.24 kg/m    Physical Exam Vitals reviewed.  Constitutional:      Appearance: Normal appearance.  HENT:     Mouth/Throat:     Mouth: Mucous membranes are moist.     Pharynx: Oropharynx is clear.  Cardiovascular:     Rate and Rhythm: Normal rate and regular rhythm.     Pulses: Normal pulses.      Heart sounds: Normal heart sounds.  Pulmonary:     Effort: Pulmonary effort is normal.     Breath sounds: Normal breath sounds.  Abdominal:     General: Abdomen is flat.     Palpations: Abdomen is soft.  Musculoskeletal:        General: Normal range of motion.     Cervical back: Normal range of motion.  Neurological:     General: No focal deficit present.     Mental Status: She is alert and oriented to person, place, and time. Mental status is at baseline.  Psychiatric:        Mood and Affect: Mood normal.        Behavior: Behavior normal.  Thought Content: Thought content normal.    Assessment/Plan: 1. Uncontrolled type 2 diabetes mellitus with hyperglycemia (HCC) A1C 5.1 today, will decrease Rybelsus to 7 mg daily and continue with routine monitoring. Encouraged to monitor her glucose levels at home and to be aware of potential hypoglycemic events. - POCT HgB A1C - Semaglutide (RYBELSUS) 7 MG TABS; Take 7 mg by mouth daily.  Dispense: 30 tablet; Refill: 1  2. Hypertension, unspecified type BP on low end today, HR stable. Will discontinue Zestoretic at this time, will start lisinopril 10 mg daily. Will assess BP response to adjusted therapy, may need further adjustments as she continues on weight loss journey. - lisinopril (ZESTRIL) 10 MG tablet; Take 1 tablet (10 mg total) by mouth daily.  Dispense: 90 tablet; Refill: 3  3. BMI 38.0-38.9,adult Praised for significant weight gain. Encouraged to continue with healthy eating as well as daily exercise.  4. Flu vaccine need - Flu Vaccine MDCK QUAD PF  5. Encounter for health maintenance examination with abnormal findings Labs ordered for upcoming CPE. - Lipid Panel With LDL/HDL Ratio - CBC w/Diff/Platelet - Comprehensive Metabolic Panel (CMET) - TSH + free T4 - B12 - Vitamin D 1,25 dihydroxy  General Counseling: Yaslyn verbalizes understanding of the findings of todays visit and agrees with plan of treatment. I have  discussed any further diagnostic evaluation that may be needed or ordered today. We also reviewed her medications today. she has been encouraged to call the office with any questions or concerns that should arise related to todays visit.    Orders Placed This Encounter  Procedures  . Flu Vaccine MDCK QUAD PF  . Lipid Panel With LDL/HDL Ratio  . CBC w/Diff/Platelet  . Comprehensive Metabolic Panel (CMET)  . TSH + free T4  . B12  . Vitamin D 1,25 dihydroxy  . POCT HgB A1C    Meds ordered this encounter  Medications  . lisinopril (ZESTRIL) 10 MG tablet    Sig: Take 1 tablet (10 mg total) by mouth daily.    Dispense:  90 tablet    Refill:  3  . Semaglutide (RYBELSUS) 7 MG TABS    Sig: Take 7 mg by mouth daily.    Dispense:  30 tablet    Refill:  1    Time spent: 30 Minutes Time spent includes review of chart, medications, test results and follow-up plan with the patient.  This patient was seen by Theodoro Grist AGNP-C in Collaboration with Dr Lavera Guise as a part of collaborative care agreement     Tanna Furry. Yassmin Binegar AGNP-C Internal medicine

## 2020-04-17 ENCOUNTER — Encounter: Payer: Self-pay | Admitting: Hospice and Palliative Medicine

## 2020-05-13 ENCOUNTER — Ambulatory Visit (INDEPENDENT_AMBULATORY_CARE_PROVIDER_SITE_OTHER): Payer: 59 | Admitting: Obstetrics and Gynecology

## 2020-05-13 ENCOUNTER — Encounter: Payer: Self-pay | Admitting: Obstetrics and Gynecology

## 2020-05-13 ENCOUNTER — Other Ambulatory Visit (HOSPITAL_COMMUNITY)
Admission: RE | Admit: 2020-05-13 | Discharge: 2020-05-13 | Disposition: A | Discharge status: Home / Self Care | Payer: 59 | Source: Ambulatory Visit | Attending: Obstetrics and Gynecology | Admitting: Obstetrics and Gynecology

## 2020-05-13 ENCOUNTER — Other Ambulatory Visit: Payer: Self-pay

## 2020-05-13 VITALS — BP 109/70 | HR 66 | Ht 64.0 in | Wt 220.0 lb

## 2020-05-13 DIAGNOSIS — N921 Excessive and frequent menstruation with irregular cycle: Secondary | ICD-10-CM

## 2020-05-13 MED ORDER — MEDROXYPROGESTERONE ACETATE 10 MG PO TABS: 10.0000 mg | ORAL_TABLET | Freq: Every day | ORAL | 0 refills | Status: DC

## 2020-05-13 NOTE — Progress Notes (Signed)
Obstetrics & Gynecology Office Visit   Chief Complaint:  Chief Complaint  Patient presents with  . Menstrual Problem    Started 03/29/20 has not stopped    History of Present Illness: 54 y.o. G0P0000 female who presents in follow up from her visit in August of this year where she had begun to have periods after having a gastric sleeve.  She was started on POPs for contraception at that time.  Her bleeding has been irregular with some days lighter than others. She has passed clots occasionally.  She began taking POPs back in August.  She has had ongoing bleeding since that time.    Past Medical History:  Diagnosis Date  . Abnormal uterine bleeding (AUB)   . Anemia   . Chronic kidney disease   . DM (diabetes mellitus) ([place])   . Fibroid   . GERD (gastroesophageal reflux disease)   . Headache    h/o  . Hyperlipidemia   . Hyperlipidemia   . Hypertension   . Metrorrhagia   . Obesity    bmi 54  . PCOS (polycystic ovarian syndrome)   . S/P endometrial ablation   . Vaginal Pap smear, abnormal 2009   pos hvp    Past Surgical History:  Procedure Laterality Date  . COLONOSCOPY    . DILATATION & CURETTAGE/HYSTEROSCOPY WITH MYOSURE N/A 11/28/2014   Procedure: DILATATION & CURETTAGE/HYSTEROSCOPY WITH MYOSURE;  Surgeon: Will Bonnet, MD;  Location: [facility]S;  Service: Gynecology;  Laterality: N/A;  . DILATION AND CURETTAGE OF UTERUS    . LAPAROSCOPIC GASTRIC SLEEVE RESECTION N/A 10/29/2019   Procedure: LAPAROSCOPIC GASTRIC SLEEVE RESECTION, Upper Endo, ERAS Pathway;  Surgeon: Johnathan Hausen, MD;  Location: [facility];  Service: General;  Laterality: N/A;  . NOVASURE ABLATION    . TONSILLECTOMY      Gynecologic History: Patient's last menstrual period was 03/29/2020 (exact date).  Obstetric History: G0P0000  Family History  Problem Relation Age of Onset  . Colon cancer Mother 8  . Colon cancer Father 25  . Breast cancer Maternal Grandmother 14    Social History    Socioeconomic History  . Marital status: Single    Spouse name: Not on file  . Number of children: Not on file  . Years of education: Not on file  . Highest education level: Not on file  Occupational History  . Not on file  Tobacco Use  . Smoking status: Never Smoker  . Smokeless tobacco: Never Used  Vaping Use  . Vaping Use: Never used  Substance and Sexual Activity  . Alcohol use: Not Currently    Comment: socially  . Drug use: No  . Sexual activity: Yes    Partners: Male    Birth control/protection: None  Other Topics Concern  . Not on file  Social History Narrative  . Not on file   Social Determinants of Health   Financial Resource Strain:   . Difficulty of Paying Living Expenses: Not on file  Food Insecurity:   . Worried About Charity fundraiser in the Last Year: Not on file  . Ran Out of Food in the Last Year: Not on file  Transportation Needs:   . Lack of Transportation (Medical): Not on file  . Lack of Transportation (Non-Medical): Not on file  Physical Activity:   . Days of Exercise per Week: Not on file  . Minutes of Exercise per Session: Not on file  Stress:   . Feeling of Stress : Not on  file  Social Connections:   . Frequency of Communication with Friends and Family: Not on file  . Frequency of Social Gatherings with Friends and Family: Not on file  . Attends Religious Services: Not on file  . Active Member of Clubs or Organizations: Not on file  . Attends Archivist Meetings: Not on file  . Marital Status: Not on file  Intimate Partner Violence:   . Fear of Current or Ex-Partner: Not on file  . Emotionally Abused: Not on file  . Physically Abused: Not on file  . Sexually Abused: Not on file    No Known Allergies  Prior to Admission medications   Medication Sig Start Date End Date Taking? Authorizing Provider  CALCIUM PO Take 1 tablet by mouth 3 (three) times daily.   Yes [provider]  lisinopril (ZESTRIL) 10 MG tablet  Take 1 tablet (10 mg total) by mouth daily. 04/16/20  Yes Luiz Ochoa, NP  norethindrone (MICRONOR) 0.35 MG tablet Take 1 tablet (0.35 mg total) by mouth daily. 02/27/20  Yes Will Bonnet, MD  Semaglutide (RYBELSUS) 7 MG TABS Take 7 mg by mouth daily. 04/16/20  Yes Luiz Ochoa, NP  Vitamin D, Ergocalciferol, (DRISDOL) 1.25 MG (50000 UNIT) CAPS capsule TAKE 1 CAPSULE (50,000 UNITS TOTAL) BY MOUTH EVERY 7 (SEVEN) DAYS. 03/17/20  Yes Scar[place], Audie Clear, NP  RYBELSUS 14 MG TABS TAKE 1 TABLET BY MOUTH EVERY DAY Patient not taking: Reported on 05/13/2020 03/27/20   Kendell Bane, NP    Review of Systems  Constitutional: Negative.   HENT: Negative.   Eyes: Negative.   Respiratory: Negative.   Cardiovascular: Negative.   Gastrointestinal: Negative.   Genitourinary: Negative.        Vaginal bleeding  Musculoskeletal: Negative.   Skin: Negative.   Neurological: Negative.   Psychiatric/Behavioral: Negative.      Physical Exam BP 109/70   Pulse 66   Ht 5\' 4"  (1.626 m)   Wt 220 lb (99.8 kg)   LMP 03/29/2020 (Exact Date) Comment: Hasn't stopped   BMI 37.76 kg/m  Patient's last menstrual period was 03/29/2020 (exact date). Physical Exam Constitutional:      General: She is not in acute distress.    Appearance: Normal appearance.  Genitourinary:     Pelvic exam was performed with patient in the lithotomy position.     Vulva, inguinal canal, urethra, bladder, vagina, cervix, uterus, right adnexa and left adnexa normal.     Genitourinary Comments: Exam limited by body habitus  HENT:     Head: Normocephalic and atraumatic.  Eyes:     General: No scleral icterus.    Conjunctiva/sclera: Conjunctivae normal.  Neurological:     General: No focal deficit present.     Mental Status: She is alert and oriented to person, place, and time.     Cranial Nerves: No cranial nerve deficit.  Psychiatric:        Mood and Affect: Mood normal.        Behavior: Behavior normal.         Judgment: Judgment normal.    Endometrial Biopsy After discussion with the patient regarding her abnormal uterine bleeding I recommended that she proceed with an endometrial biopsy for further diagnosis. The risks, benefits, alternatives, and indications for an endometrial biopsy were discussed with the patient in detail. She understood the risks including infection, bleeding, cervical laceration and uterine perforation.  Verbal consent was obtained.   PROCEDURE NOTE:  Pipelle endometrial  biopsy was performed using aseptic technique with iodine preparation.  The uterus was sounded to a length of 8 cm.  Adequate sampling was obtained with minimal blood loss.  The patient tolerated the procedure well.  Disposition will be pending pathology.   Female chaperone present for pelvic and breast  portions of the physical exam  Assessment: 54 y.o. G0P0000 female here for  1. Menorrhagia with irregular cycle      Plan: Problem List Items Addressed This Visit    None    Visit Diagnoses    Menorrhagia with irregular cycle    -  Primary   Relevant Medications   medroxyPROGESTERone (PROVERA) 10 MG tablet   Other Relevant Orders   Surgical pathology      Her bleeding is likely due to taking POPs.  The timing is such that this makes sense. We discussed the expected possible bleeding patterns while using this medication.  She is unsure of whether she'll continue the medication due to these side effects.  May also need to check an [facility] to assess menopausal status. However, she is taking POPs to prevent pregnancy and she is at low likelihood of pregnancy given age.  Endometrial biopsy today. Pap smear is up to date.   She would like to give the medication more time or she may stop the medication.   A total of 25 minutes were spent face-to-face with the patient as well as preparation, review, communication, and documentation during this encounter.    Prentice Docker, MD 05/13/2020 11:02 AM

## 2020-05-14 ENCOUNTER — Encounter: Payer: Self-pay | Admitting: Obstetrics and Gynecology

## 2020-05-14 LAB — SURGICAL PATHOLOGY

## 2020-05-28 ENCOUNTER — Telehealth: Payer: Self-pay

## 2020-05-28 NOTE — Telephone Encounter (Signed)
lmom that sample for rybelsus  Sample is ready for pickup

## 2020-06-01 ENCOUNTER — Telehealth: Payer: Self-pay

## 2020-06-01 ENCOUNTER — Other Ambulatory Visit: Payer: Self-pay

## 2020-06-01 DIAGNOSIS — E1165 Type 2 diabetes mellitus with hyperglycemia: Secondary | ICD-10-CM

## 2020-06-01 NOTE — Telephone Encounter (Signed)
PA approved for Rybelsus 7 mg  From 05/29/2020 to 05/27/2021.  New prescription sent to pharmacy.

## 2020-06-02 MED ORDER — RYBELSUS 7 MG PO TABS: 7.0000 mg | ORAL_TABLET | Freq: Every day | ORAL | 1 refills | Status: DC

## 2020-06-30 ENCOUNTER — Ambulatory Visit: Payer: 59 | Admitting: Dietician

## 2020-07-08 ENCOUNTER — Encounter: Payer: 59 | Attending: Surgery | Admitting: Skilled Nursing Facility1

## 2020-07-08 ENCOUNTER — Other Ambulatory Visit: Payer: Self-pay

## 2020-07-08 DIAGNOSIS — E1122 Type 2 diabetes mellitus with diabetic chronic kidney disease: Secondary | ICD-10-CM | POA: Diagnosis present

## 2020-07-08 DIAGNOSIS — N182 Chronic kidney disease, stage 2 (mild): Secondary | ICD-10-CM | POA: Insufficient documentation

## 2020-07-08 DIAGNOSIS — Z6841 Body Mass Index (BMI) 40.0 and over, adult: Secondary | ICD-10-CM | POA: Insufficient documentation

## 2020-07-08 NOTE — Progress Notes (Signed)
Follow-up visit:  Post-Operative sleeve Surgery  Medical Nutrition Therapy  Primary concerns today: Post-operative Bariatric Surgery Nutrition Management  04/13-sleeve  Pt feels great and states she is doing really well. Pt could not do body comp due to wearing panty hose.  Pt states she has one day off per week and that is the day she is not as good with drinking her fluids.   Body Composition Scale Date  Weight  lbs 224.6  Total Body Fat  % 43.4     Visceral Fat 15  Fat-Free Mass  % 56.5     Total Body Water  % 42.7     Muscle-Mass  lbs 30.6  BMI 38.4  Body Fat Displacement ---        Torso  lbs 60.4        Left Leg  lbs 12        Right Leg  lbs 12        Left Arm  lbs 6        Right Arm  lbs 6   24 hr recall: Breakfast: protein shake Lunch: chicken or half hamburger  Snack: pork rinds or american cheese slice  Dinner: chicken + brussel sprouts  Beverages: crystal light in water; plain water  Fluid intake: 64-72 oz  Medications: See List Supplementation: appropriate    Using straws: no Drinking while eating: no Having you been chewing well: yes Chewing/swallowing difficulties: no Changes in vision: no Changes to mood/headaches: no Hair loss/Cahnges to skin/Changes to nails: hair regrowth Any difficulty focusing or concentrating: no Sweating: no Dizziness/Lightheaded: no Palpitations: no  Carbonated beverages: no N/V/D/C/GAS: using miralax once a week; bowel movement 3 times a week Abdominal Pain: no Dumping syndrome: no  Recent physical activity:  4 days a week 45-60 minutes   Progress Towards Goal(s):  In Progress  Handouts given during visit include:  Phase 5  Pt Chosen Goals: -Every morning have a piece of fruit with your protein shake -Ensure you have non starchy vegetables with lunch and dinner 7 days a week -variety is important with your snacks and meals  Teaching Method Utilized:  Visual Auditory Hands on  Demonstrated degree of  understanding via:  Teach Back   Monitoring/Evaluation:  Dietary intake, exercise, and body weight. Follow up in 3 months

## 2020-07-14 ENCOUNTER — Other Ambulatory Visit: Payer: Self-pay

## 2020-07-14 ENCOUNTER — Encounter: Payer: Self-pay | Admitting: Hospice and Palliative Medicine

## 2020-07-14 ENCOUNTER — Ambulatory Visit: Payer: 59 | Admitting: Hospice and Palliative Medicine

## 2020-07-14 VITALS — BP 116/68 | HR 80 | Temp 97.4°F | Resp 16 | Ht 64.0 in | Wt 209.8 lb

## 2020-07-14 DIAGNOSIS — Z6836 Body mass index (BMI) 36.0-36.9, adult: Secondary | ICD-10-CM

## 2020-07-14 DIAGNOSIS — I10 Essential (primary) hypertension: Secondary | ICD-10-CM | POA: Diagnosis not present

## 2020-07-14 DIAGNOSIS — E559 Vitamin D deficiency, unspecified: Secondary | ICD-10-CM | POA: Diagnosis not present

## 2020-07-14 DIAGNOSIS — E1165 Type 2 diabetes mellitus with hyperglycemia: Secondary | ICD-10-CM | POA: Diagnosis not present

## 2020-07-14 LAB — POCT GLYCOSYLATED HEMOGLOBIN (HGB A1C): Hemoglobin A1C: 4.9 % (ref 4.0–5.6)

## 2020-07-14 MED ORDER — HYDROCHLOROTHIAZIDE 12.5 MG PO TABS: 12.5000 mg | ORAL_TABLET | Freq: Every day | ORAL | 2 refills | Status: DC

## 2020-07-14 MED ORDER — VITAMIN D (ERGOCALCIFEROL) 1.25 MG (50000 UNIT) PO CAPS: 50000.0000 [IU] | ORAL_CAPSULE | ORAL | 0 refills | Status: DC

## 2020-07-14 MED ORDER — RYBELSUS 3 MG PO TABS: 3.0000 mg | ORAL_TABLET | Freq: Every day | ORAL | 3 refills | Status: DC

## 2020-07-14 MED ORDER — LISINOPRIL 5 MG PO TABS: 5.0000 mg | ORAL_TABLET | Freq: Every day | ORAL | 3 refills | Status: DC

## 2020-07-14 NOTE — Progress Notes (Signed)
[facility] [place], [place] [address]  Internal MEDICINE  Office Visit Note  Patient Name: Rachel Gordon  [id number]  [id number]  Date of Service: 07/19/2020  Chief Complaint  Patient presents with  . Follow-up  . Hyperlipidemia  . Hypertension  . Gastroesophageal Reflux  . Diabetes  . Anemia  . Quality Metric Gaps    Mammogram, pneumovax    HPI Patient is here for routine follow-up She continues to do well after bariatric surgery in April--13 pound weight loss since our last visit We have been slowly tapering her down on her DM and HTN medications as her numbers are improving with weight loss She is closely followed by a dietician and is slowly introducing more foods back into her diet since surgery  She has been on Reybelsus, we have been tapering down this dose as her A1C continues to improve--currently on 7 mg daily For HTN-currently taking lisinopril 10 mg--HCTZ discontinued at last visit, she has noticed since stopping HCTZ she is having some swelling in her ankles and hands--swelling usually more apparent in the mornings and evenings--blood pressure has not been affected by this  She has not been able to have her labs done yet--holidays and she has been having some issues with her mother and the facility she is staying at   Current Medication: Outpatient Encounter Medications as of 07/14/2020  Medication Sig  . CALCIUM PO Take 1 tablet by mouth 3 (three) times daily.  . hydrochlorothiazide (HYDRODIURIL) 12.5 MG tablet Take 1 tablet (12.5 mg total) by mouth daily.  Marland Kitchen lisinopril (ZESTRIL) 5 MG tablet Take 1 tablet (5 mg total) by mouth daily.  . Semaglutide (RYBELSUS) 3 MG TABS Take 3 mg by mouth daily.  . [DISCONTINUED] lisinopril (ZESTRIL) 10 MG tablet Take 1 tablet (10 mg total) by mouth daily.  . [DISCONTINUED] Semaglutide (RYBELSUS) 7 MG TABS Take 7 mg by mouth daily.  . [DISCONTINUED] Vitamin D, Ergocalciferol, (DRISDOL) 1.25 MG  (50000 UNIT) CAPS capsule TAKE 1 CAPSULE (50,000 UNITS TOTAL) BY MOUTH EVERY 7 (SEVEN) DAYS.  Marland Kitchen medroxyPROGESTERone (PROVERA) 10 MG tablet Take 1 tablet (10 mg total) by mouth daily for 10 days.  . norethindrone (MICRONOR) 0.35 MG tablet Take 1 tablet (0.35 mg total) by mouth daily. (Patient not taking: Reported on 07/14/2020)  . Vitamin D, Ergocalciferol, (DRISDOL) 1.25 MG (50000 UNIT) CAPS capsule Take 1 capsule (50,000 Units total) by mouth every 7 (seven) days.   No facility-administered encounter medications on file as of 07/14/2020.    Surgical History: Past Surgical History:  Procedure Laterality Date  . COLONOSCOPY    . DILATATION & CURETTAGE/HYSTEROSCOPY WITH MYOSURE N/A 11/28/2014   Procedure: DILATATION & CURETTAGE/HYSTEROSCOPY WITH MYOSURE;  Surgeon: Will Bonnet, MD;  Location: [facility] ORS;  Service: Gynecology;  Laterality: N/A;  . DILATION AND CURETTAGE OF UTERUS    . LAPAROSCOPIC GASTRIC SLEEVE RESECTION N/A 10/29/2019   Procedure: LAPAROSCOPIC GASTRIC SLEEVE RESECTION, Upper Endo, ERAS Pathway;  Surgeon: Johnathan Hausen, MD;  Location: [facility];  Service: General;  Laterality: N/A;  . NOVASURE ABLATION    . TONSILLECTOMY      Medical History: Past Medical History:  Diagnosis Date  . Abnormal uterine bleeding (AUB)   . Anemia   . Chronic kidney disease   . DM (diabetes mellitus) ([place])   . Fibroid   . GERD (gastroesophageal reflux disease)   . Headache    h/o  . Hyperlipidemia   . Hyperlipidemia   . Hypertension   .  Metrorrhagia   . Obesity    bmi 54  . PCOS (polycystic ovarian syndrome)   . S/P endometrial ablation   . Vaginal Pap smear, abnormal 2009   pos hvp    Family History: Family History  Problem Relation Age of Onset  . Colon cancer Mother 46  . Colon cancer Father 2  . Breast cancer Maternal Grandmother 74    Social History   Socioeconomic History  . Marital status: Single    Spouse name: Not on file  . Number of children: Not on file   . Years of education: Not on file  . Highest education level: Not on file  Occupational History  . Not on file  Tobacco Use  . Smoking status: Never Smoker  . Smokeless tobacco: Never Used  Vaping Use  . Vaping Use: Never used  Substance and Sexual Activity  . Alcohol use: Not Currently    Comment: socially  . Drug use: No  . Sexual activity: Yes    Partners: Male    Birth control/protection: None  Other Topics Concern  . Not on file  Social History Narrative  . Not on file   Social Determinants of Health   Financial Resource Strain: Not on file  Food Insecurity: Not on file  Transportation Needs: Not on file  Physical Activity: Not on file  Stress: Not on file  Social Connections: Not on file  Intimate Partner Violence: Not on file      Review of Systems  Constitutional: Negative for chills, diaphoresis and fatigue.  HENT: Negative for ear pain, postnasal drip and sinus pressure.   Eyes: Negative for photophobia, discharge, redness, itching and visual disturbance.  Respiratory: Negative for cough, shortness of breath and wheezing.   Cardiovascular: Negative for chest pain, palpitations and leg swelling.  Gastrointestinal: Negative for abdominal pain, constipation, diarrhea, nausea and vomiting.  Genitourinary: Negative for dysuria and flank pain.  Musculoskeletal: Negative for arthralgias, back pain, gait problem and neck pain.  Skin: Negative for color change.  Allergic/Immunologic: Negative for environmental allergies and food allergies.  Neurological: Negative for dizziness and headaches.  Hematological: Does not bruise/bleed easily.  Psychiatric/Behavioral: Negative for agitation, behavioral problems (depression) and hallucinations.    Vital Signs: BP 116/68   Pulse 80   Temp (!) 97.4 F (36.3 C)   Resp 16   Ht 5\' 4"  (1.626 m)   Wt 209 lb 12.8 oz (95.2 kg)   SpO2 99%   BMI 36.01 kg/m    Physical Exam Vitals reviewed.  Constitutional:       Appearance: Normal appearance. She is obese.  Cardiovascular:     Rate and Rhythm: Normal rate and regular rhythm.     Pulses: Normal pulses.     Heart sounds: Normal heart sounds.  Pulmonary:     Effort: Pulmonary effort is normal.     Breath sounds: Normal breath sounds.  Abdominal:     General: Abdomen is flat.     Palpations: Abdomen is soft.  Musculoskeletal:        General: Normal range of motion.     Cervical back: Normal range of motion.  Skin:    General: Skin is warm.  Neurological:     General: No focal deficit present.     Mental Status: She is alert and oriented to person, place, and time. Mental status is at baseline.  Psychiatric:        Mood and Affect: Mood normal.  Behavior: Behavior normal.        Thought Content: Thought content normal.        Judgment: Judgment normal.    Assessment/Plan: 1. Uncontrolled type 2 diabetes mellitus with hyperglycemia (HCC) A1C 4.9, continues to lower with weight loss, we will taper her down to Reybelsus 3 mg Strongly advised to be aware of hypoglycemia--at next A1C check if levels continue to be stable or lowered will discontinue Reybelsus at that time - POCT HgB A1C - Semaglutide (RYBELSUS) 3 MG TABS; Take 3 mg by mouth daily.  Dispense: 30 tablet; Refill: 3  2. Hypertension, unspecified type Decrease dose of lisinopril to 5 mg daily and add back low dose HCTZ for swelling Will likely discontinue lisinopril at next visit if BP continues to be stable - hydrochlorothiazide (HYDRODIURIL) 12.5 MG tablet; Take 1 tablet (12.5 mg total) by mouth daily.  Dispense: 90 tablet; Refill: 2 - lisinopril (ZESTRIL) 5 MG tablet; Take 1 tablet (5 mg total) by mouth daily.  Dispense: 90 tablet; Refill: 3  3. BMI 36.0-36.9,adult Praised for successful weight loss journey, encouraged to continue with healthy lifestyle  4. Vitamin D deficiency Will need updated labs--requesting refills, will be important to monitor vitamin levels after  bariatric surgery and replace as needed - Vitamin D, Ergocalciferol, (DRISDOL) 1.25 MG (50000 UNIT) CAPS capsule; Take 1 capsule (50,000 Units total) by mouth every 7 (seven) days.  Dispense: 12 capsule; Refill: 0  General Counseling: Tyniah verbalizes understanding of the findings of todays visit and agrees with plan of treatment. I have discussed any further diagnostic evaluation that may be needed or ordered today. We also reviewed her medications today. she has been encouraged to call the office with any questions or concerns that should arise related to todays visit.    Orders Placed This Encounter  Procedures  . POCT HgB A1C    Meds ordered this encounter  Medications  . Vitamin D, Ergocalciferol, (DRISDOL) 1.25 MG (50000 UNIT) CAPS capsule    Sig: Take 1 capsule (50,000 Units total) by mouth every 7 (seven) days.    Dispense:  12 capsule    Refill:  0  . hydrochlorothiazide (HYDRODIURIL) 12.5 MG tablet    Sig: Take 1 tablet (12.5 mg total) by mouth daily.    Dispense:  90 tablet    Refill:  2  . lisinopril (ZESTRIL) 5 MG tablet    Sig: Take 1 tablet (5 mg total) by mouth daily.    Dispense:  90 tablet    Refill:  3  . Semaglutide (RYBELSUS) 3 MG TABS    Sig: Take 3 mg by mouth daily.    Dispense:  30 tablet    Refill:  3    Time spent: 30 Minutes Time spent includes review of chart, medications, test results and follow-up plan with the patient.  This patient was seen by Theodoro Grist AGNP-C in Collaboration with Dr Lavera Guise as a part of collaborative care agreement     Tanna Furry. Savion Washam AGNP-C Internal medicine

## 2020-07-19 ENCOUNTER — Encounter: Payer: Self-pay | Admitting: Hospice and Palliative Medicine

## 2020-07-28 ENCOUNTER — Telehealth: Payer: Self-pay

## 2020-07-28 NOTE — Telephone Encounter (Signed)
PA approved for RYBELSUS 3 mg Case ID: [id number] from 06/27/2020 to 07/27/2023

## 2020-08-22 IMAGING — CR DG CHEST 2V
1 series · 2 of 2 positions shown · non-contrast
Comparison: None.

CLINICAL DATA: Preop gastric sleeve procedure

EXAM:
CHEST - 2 VIEW

[Series 1: dg chest 2 view · 0.14mm/px · 2 of 2 slices shown]
[im 1/2]
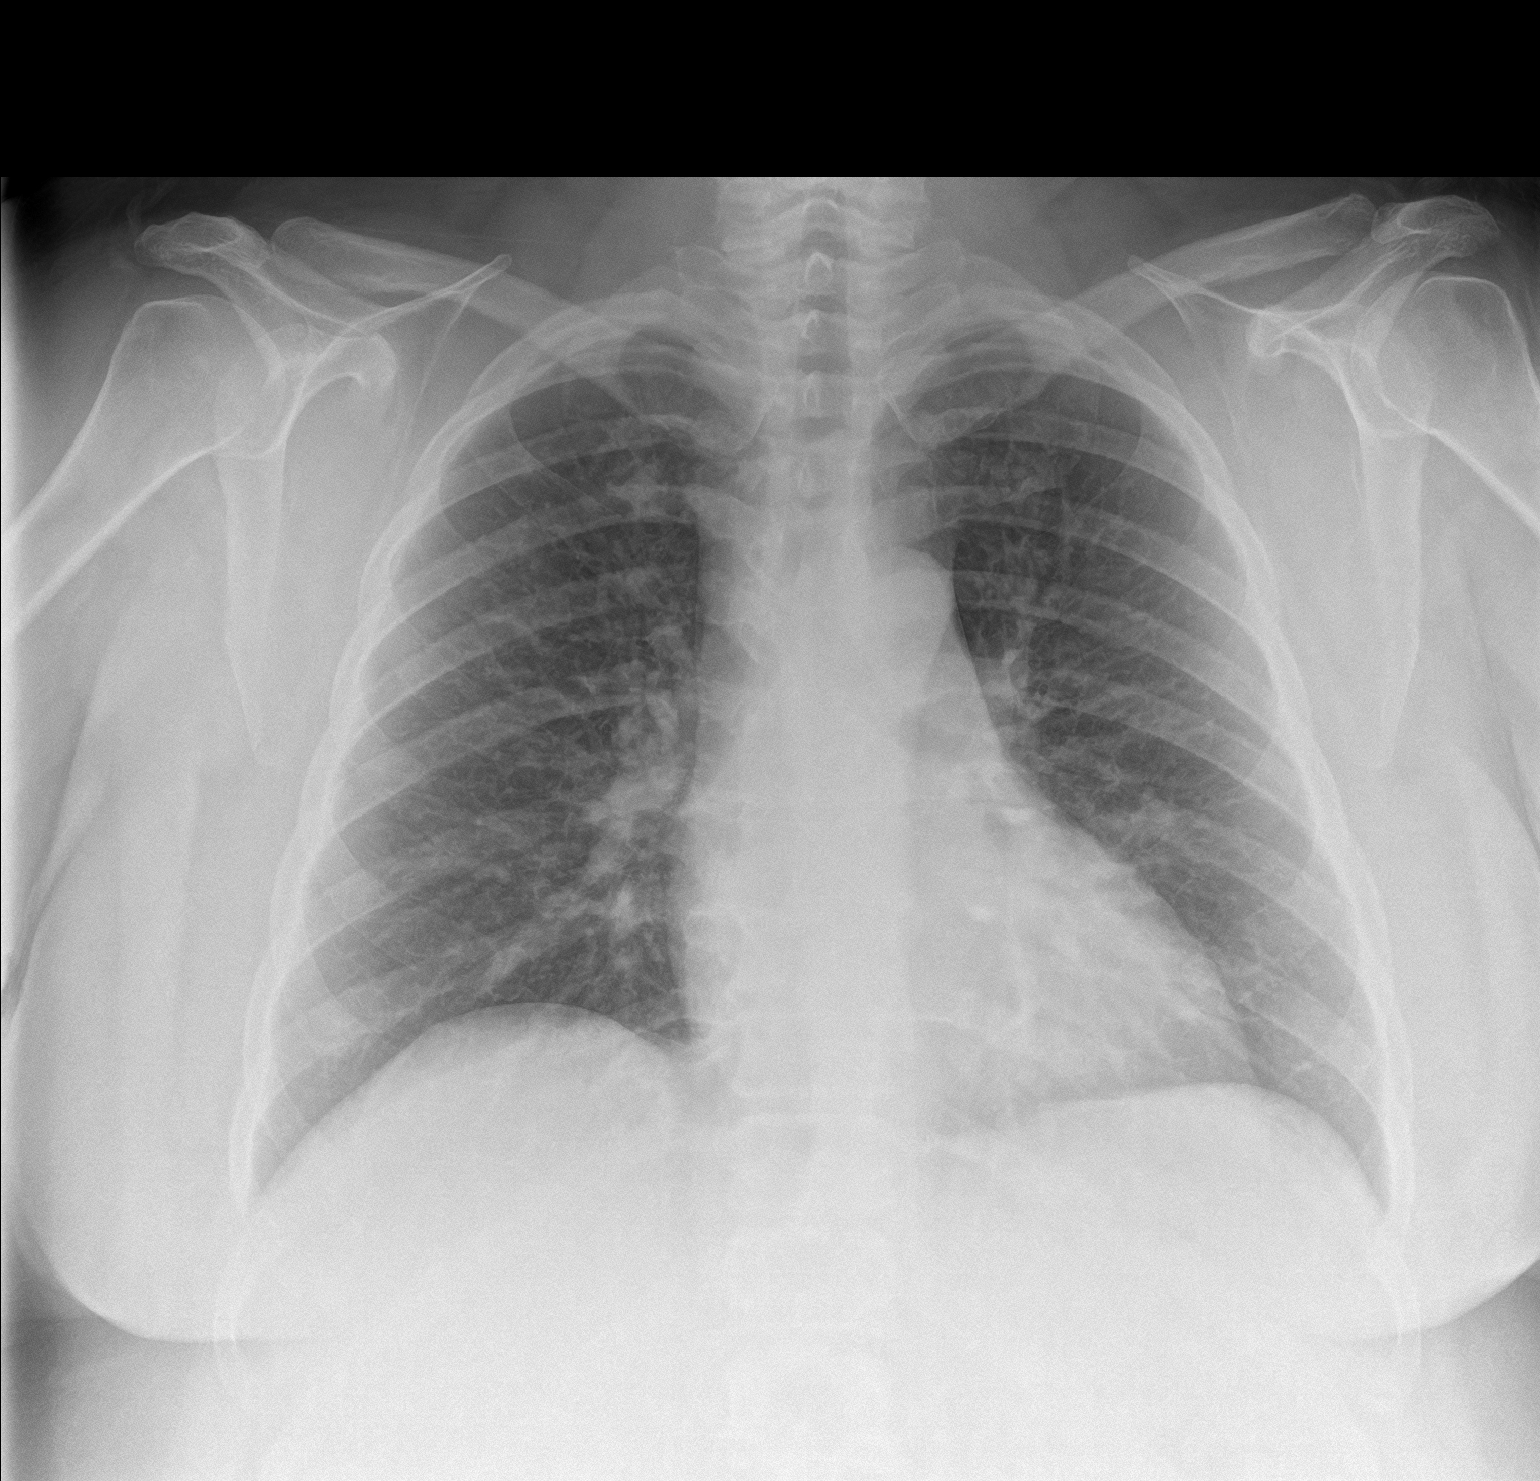
[im 2/2]
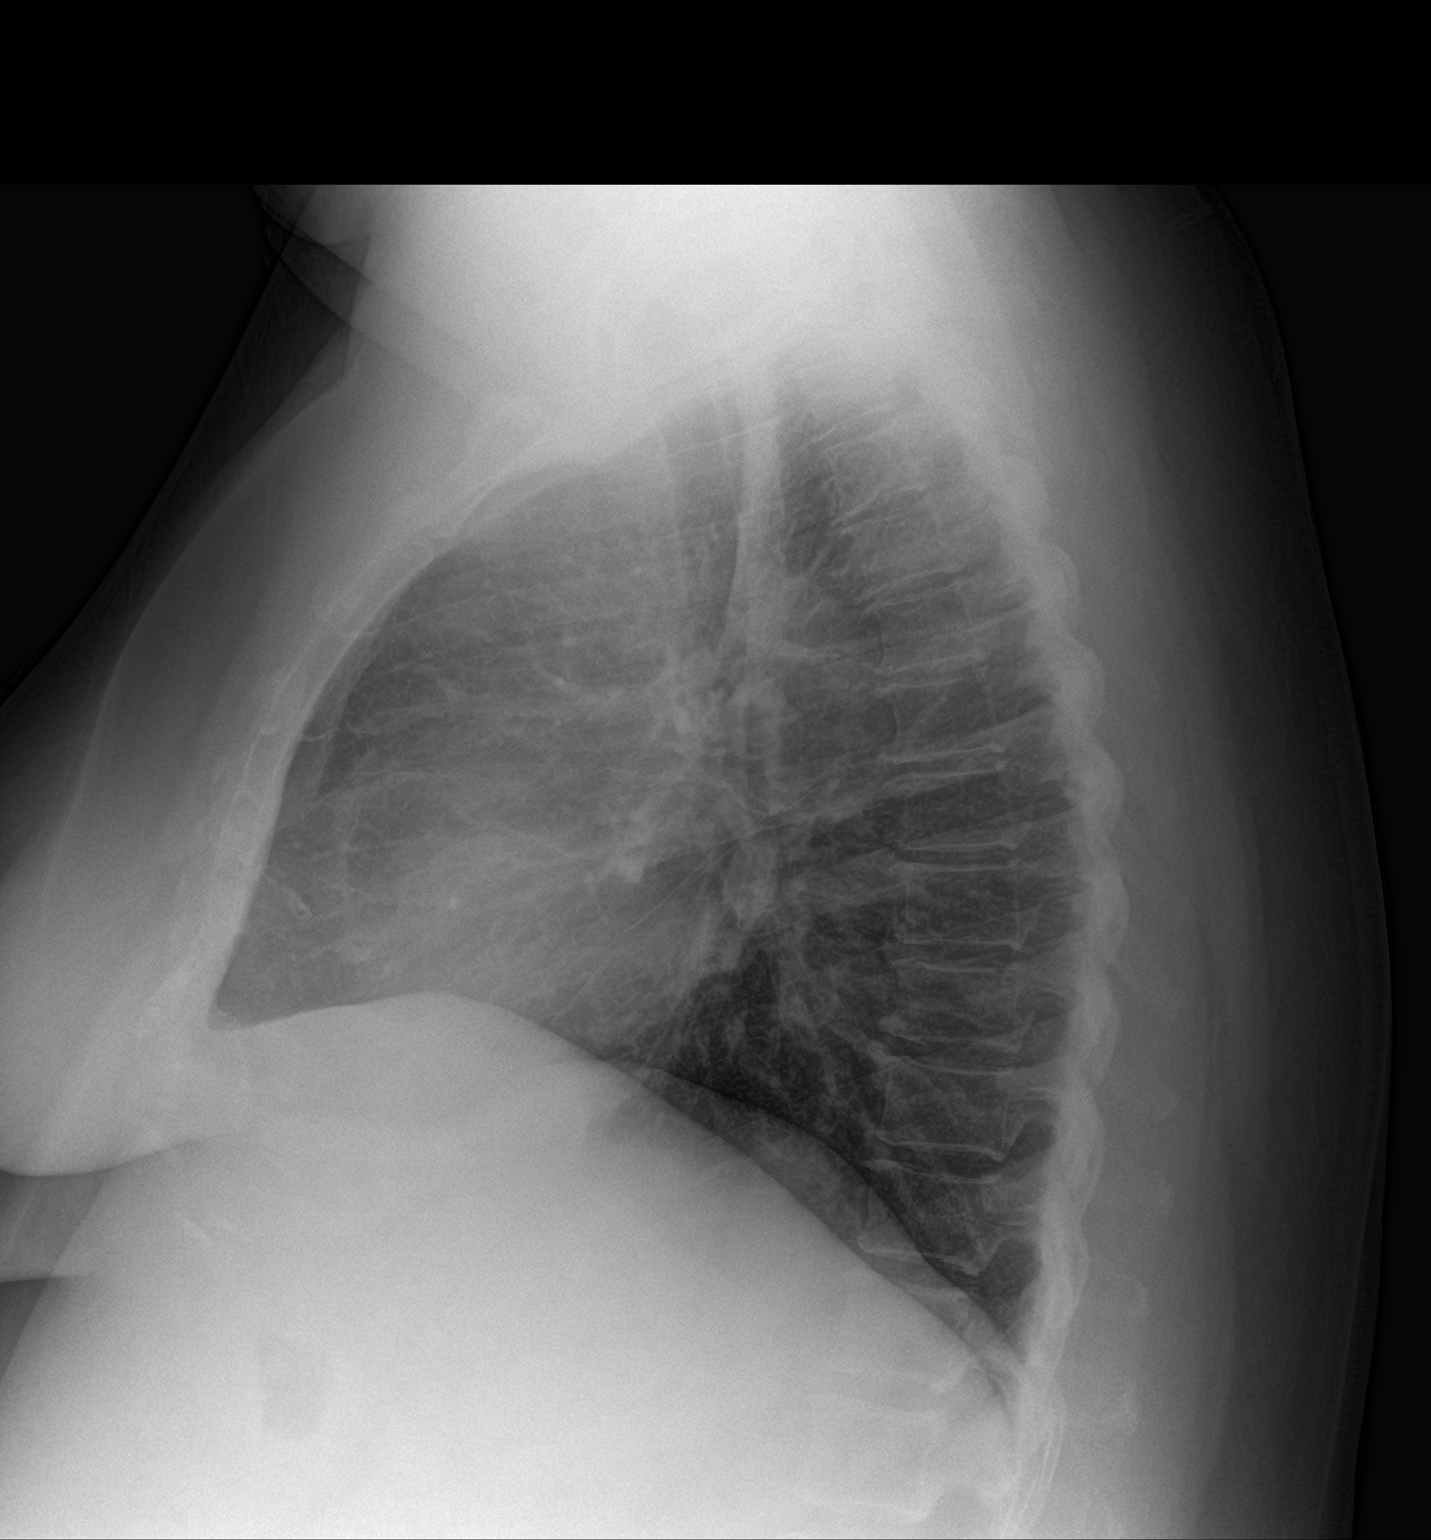

[2 of 2 positions shown; findings below may reference images not displayed]

FINDINGS: The heart size and mediastinal contours are within normal limits.
Both lungs are clear. The visualized skeletal structures are
unremarkable.
IMPRESSION: No active cardiopulmonary disease.

## 2020-09-01 ENCOUNTER — Other Ambulatory Visit: Payer: Self-pay | Admitting: Hospice and Palliative Medicine

## 2020-09-01 ENCOUNTER — Encounter: Payer: Self-pay | Admitting: Hospice and Palliative Medicine

## 2020-09-01 MED ORDER — AZITHROMYCIN 250 MG PO TABS: ORAL_TABLET | ORAL | 0 refills | Status: DC

## 2020-10-03 ENCOUNTER — Other Ambulatory Visit: Payer: Self-pay | Admitting: Hospice and Palliative Medicine

## 2020-10-03 DIAGNOSIS — E559 Vitamin D deficiency, unspecified: Secondary | ICD-10-CM

## 2020-10-04 NOTE — Telephone Encounter (Signed)
Can this be refilled?  No labs drawn for vit D, order in chart from 04/16/20 but not been done

## 2020-10-05 NOTE — Telephone Encounter (Signed)
Please have her do labs before refilling Vit D

## 2020-10-08 ENCOUNTER — Ambulatory Visit: Payer: 59 | Admitting: Skilled Nursing Facility1

## 2020-10-09 ENCOUNTER — Ambulatory Visit (INDEPENDENT_AMBULATORY_CARE_PROVIDER_SITE_OTHER): Payer: 59 | Admitting: Hospice and Palliative Medicine

## 2020-10-09 ENCOUNTER — Encounter: Payer: Self-pay | Admitting: Hospice and Palliative Medicine

## 2020-10-09 VITALS — BP 116/58 | HR 70 | Temp 97.4°F | Resp 16 | Ht 64.0 in | Wt 204.0 lb

## 2020-10-09 DIAGNOSIS — E1165 Type 2 diabetes mellitus with hyperglycemia: Secondary | ICD-10-CM

## 2020-10-09 DIAGNOSIS — E119 Type 2 diabetes mellitus without complications: Secondary | ICD-10-CM | POA: Diagnosis not present

## 2020-10-09 DIAGNOSIS — E559 Vitamin D deficiency, unspecified: Secondary | ICD-10-CM | POA: Diagnosis not present

## 2020-10-09 DIAGNOSIS — I10 Essential (primary) hypertension: Secondary | ICD-10-CM

## 2020-10-09 LAB — POCT GLYCOSYLATED HEMOGLOBIN (HGB A1C): Hemoglobin A1C: 5.1 % (ref 4.0–5.6)

## 2020-10-09 NOTE — Progress Notes (Signed)
[facility] [place], [place] [address]  Internal MEDICINE  Office Visit Note  Patient Name: Rachel Gordon  [id number]  [id number]  Date of Service: 10/12/2020  Chief Complaint  Patient presents with  . Follow-up  . Diabetes  . Gastroesophageal Reflux  . Hypertension  . Hyperlipidemia  . Anemia  . Quality Metric Gaps    Mammogram, pneumovax    HPI Patient is here for routine follow-up Bariatric surgery last year, noted further 5 pound weight loss since our last visit Continues to take low dose Rybelsus for A1C coverage as well as appetite suppressant BP lower end of normal today--randomly monitors at home, BP has been averaging 110/80  Has not yet had a chance to have her labs drawn--will need to review for vitamin deficiencies  Current Medication: Outpatient Encounter Medications as of 10/09/2020  Medication Sig  . CALCIUM PO Take 1 tablet by mouth 3 (three) times daily.  . hydrochlorothiazide (HYDRODIURIL) 12.5 MG tablet Take 1 tablet (12.5 mg total) by mouth daily.  Marland Kitchen lisinopril (ZESTRIL) 5 MG tablet Take 1 tablet (5 mg total) by mouth daily.  . Vitamin D, Ergocalciferol, (DRISDOL) 1.25 MG (50000 UNIT) CAPS capsule Take 1 capsule (50,000 Units total) by mouth every 7 (seven) days.  . [DISCONTINUED] Semaglutide (RYBELSUS) 3 MG TABS Take 3 mg by mouth daily.  . medroxyPROGESTERone (PROVERA) 10 MG tablet Take 1 tablet (10 mg total) by mouth daily for 10 days.  . [DISCONTINUED] azithromycin (ZITHROMAX Z-PAK) 250 MG tablet Take two 250 mg tablets on day one followed by one 250 mg tablet each day for four days.  . [DISCONTINUED] norethindrone (MICRONOR) 0.35 MG tablet Take 1 tablet (0.35 mg total) by mouth daily. (Patient not taking: Reported on 07/14/2020)   No facility-administered encounter medications on file as of 10/09/2020.    Surgical History: Past Surgical History:  Procedure Laterality Date  . COLONOSCOPY    . DILATATION &  CURETTAGE/HYSTEROSCOPY WITH MYOSURE N/A 11/28/2014   Procedure: DILATATION & CURETTAGE/HYSTEROSCOPY WITH MYOSURE;  Surgeon: Will Bonnet, MD;  Location: [facility] ORS;  Service: Gynecology;  Laterality: N/A;  . DILATION AND CURETTAGE OF UTERUS    . LAPAROSCOPIC GASTRIC SLEEVE RESECTION N/A 10/29/2019   Procedure: LAPAROSCOPIC GASTRIC SLEEVE RESECTION, Upper Endo, ERAS Pathway;  Surgeon: Johnathan Hausen, MD;  Location: [facility]S;  Service: General;  Laterality: N/A;  . NOVASURE ABLATION    . TONSILLECTOMY      Medical History: Past Medical History:  Diagnosis Date  . Abnormal uterine bleeding (AUB)   . Anemia   . Chronic kidney disease   . DM (diabetes mellitus) ([place])   . Fibroid   . GERD (gastroesophageal reflux disease)   . Headache    h/o  . Hyperlipidemia   . Hyperlipidemia   . Hypertension   . Metrorrhagia   . Obesity    bmi 54  . PCOS (polycystic ovarian syndrome)   . S/P endometrial ablation   . Vaginal Pap smear, abnormal 2009   pos hvp    Family History: Family History  Problem Relation Age of Onset  . Colon cancer Mother 90  . Colon cancer Father 34  . Breast cancer Maternal Grandmother 31    Social History   Socioeconomic History  . Marital status: Single    Spouse name: Not on file  . Number of children: Not on file  . Years of education: Not on file  . Highest education level: Not on file  Occupational  History  . Not on file  Tobacco Use  . Smoking status: Never Smoker  . Smokeless tobacco: Never Used  Vaping Use  . Vaping Use: Never used  Substance and Sexual Activity  . Alcohol use: Not Currently    Comment: socially  . Drug use: No  . Sexual activity: Yes    Partners: Male    Birth control/protection: None  Other Topics Concern  . Not on file  Social History Narrative  . Not on file   Social Determinants of Health   Financial Resource Strain: Not on file  Food Insecurity: Not on file  Transportation Needs: Not on file  Physical  Activity: Not on file  Stress: Not on file  Social Connections: Not on file  Intimate Partner Violence: Not on file      Review of Systems  Constitutional: Negative for chills, diaphoresis and fatigue.  HENT: Negative for ear pain, postnasal drip and sinus pressure.   Eyes: Negative for photophobia, discharge, redness, itching and visual disturbance.  Respiratory: Negative for cough, shortness of breath and wheezing.   Cardiovascular: Negative for chest pain, palpitations and leg swelling.  Gastrointestinal: Negative for abdominal pain, constipation, diarrhea, nausea and vomiting.  Genitourinary: Negative for dysuria and flank pain.  Musculoskeletal: Negative for arthralgias, back pain, gait problem and neck pain.  Skin: Negative for color change.  Allergic/Immunologic: Negative for environmental allergies and food allergies.  Neurological: Negative for dizziness and headaches.  Hematological: Does not bruise/bleed easily.  Psychiatric/Behavioral: Negative for agitation, behavioral problems (depression) and hallucinations.    Vital Signs: BP (!) 116/58   Pulse 70   Temp (!) 97.4 F (36.3 C)   Resp 16   Ht 5\' 4"  (1.626 m)   Wt 204 lb (92.5 kg)   SpO2 99%   BMI 35.02 kg/m    Physical Exam Vitals reviewed.  Constitutional:      Appearance: Normal appearance. She is normal weight.  Cardiovascular:     Rate and Rhythm: Normal rate and regular rhythm.     Pulses: Normal pulses.     Heart sounds: Normal heart sounds.  Pulmonary:     Effort: Pulmonary effort is normal.     Breath sounds: Normal breath sounds.  Abdominal:     General: Abdomen is flat.     Palpations: Abdomen is soft.  Musculoskeletal:        General: Normal range of motion.     Cervical back: Normal range of motion.  Skin:    General: Skin is warm.  Neurological:     General: No focal deficit present.     Mental Status: She is alert and oriented to person, place, and time. Mental status is at  baseline.  Psychiatric:        Mood and Affect: Mood normal.        Behavior: Behavior normal.        Thought Content: Thought content normal.        Judgment: Judgment normal.    Assessment/Plan: 1. Type 2 diabetes mellitus without complication, without long-term current use of insulin (HCC) A1C 5.1-normal, will discontinue Rybelsus, will recheck A1C in 3 months for continued monitoring - POCT HgB A1C  2. Essential hypertension Discontinue lisinopril for hypotension, continue with HCTZ for now, will readdress at next follow-up, encouraged to continue monitoring BP at home and contact office for elevated readings  3. Vitamin D deficiency Will need updated labs prior to refilling supplement  General Counseling: Julius verbalizes understanding of  the findings of todays visit and agrees with plan of treatment. I have discussed any further diagnostic evaluation that may be needed or ordered today. We also reviewed her medications today. she has been encouraged to call the office with any questions or concerns that should arise related to todays visit.    Orders Placed This Encounter  Procedures  . POCT HgB A1C   Time spent: 30 Minutes Time spent includes review of chart, medications, test results and follow-up plan with the patient.   This patient was seen by Theodoro Grist AGNP-C in Collaboration with Dr Lavera Guise as a part of collaborative care agreement     Tanna Furry. Pilot Prindle AGNP-C Internal medicine

## 2020-10-12 ENCOUNTER — Encounter: Payer: Self-pay | Admitting: Hospice and Palliative Medicine

## 2020-10-14 ENCOUNTER — Encounter: Payer: Self-pay | Admitting: Hospice and Palliative Medicine

## 2020-10-16 LAB — CBC WITH DIFFERENTIAL/PLATELET
Basophils Absolute: 0.1 10*3/uL (ref 0.0–0.2)
Basos: 1 %
EOS (ABSOLUTE): 0.1 10*3/uL (ref 0.0–0.4)
Eos: 2 %
Hematocrit: 36.5 % (ref 34.0–46.6)
Hemoglobin: 11.3 g/dL (ref 11.1–15.9)
Immature Grans (Abs): 0 10*3/uL (ref 0.0–0.1)
Immature Granulocytes: 0 %
Lymphocytes Absolute: 2 10*3/uL (ref 0.7–3.1)
Lymphs: 33 %
MCH: 23.2 pg — ABNORMAL LOW (ref 26.6–33.0)
MCHC: 31 g/dL — ABNORMAL LOW (ref 31.5–35.7)
MCV: 75 fL — ABNORMAL LOW (ref 79–97)
Monocytes Absolute: 0.6 10*3/uL (ref 0.1–0.9)
Monocytes: 9 %
Neutrophils Absolute: 3.4 10*3/uL (ref 1.4–7.0)
Neutrophils: 55 %
Platelets: 356 10*3/uL (ref 150–450)
RBC: 4.87 x10E6/uL (ref 3.77–5.28)
RDW: 16 % — ABNORMAL HIGH (ref 11.7–15.4)
WBC: 6.2 10*3/uL (ref 3.4–10.8)

## 2020-10-16 LAB — COMPREHENSIVE METABOLIC PANEL
ALT: 16 IU/L (ref 0–32)
AST: 20 IU/L (ref 0–40)
Albumin/Globulin Ratio: 1.6 (ref 1.2–2.2)
Albumin: 4.4 g/dL (ref 3.8–4.9)
Alkaline Phosphatase: 45 IU/L (ref 44–121)
BUN/Creatinine Ratio: 22 (ref 9–23)
BUN: 22 mg/dL (ref 6–24)
Bilirubin Total: 0.4 mg/dL (ref 0.0–1.2)
CO2: 21 mmol/L (ref 20–29)
Calcium: 9.9 mg/dL (ref 8.7–10.2)
Chloride: 101 mmol/L (ref 96–106)
Creatinine, Ser: 0.99 mg/dL (ref 0.57–1.00)
Globulin, Total: 2.7 g/dL (ref 1.5–4.5)
Glucose: 104 mg/dL — ABNORMAL HIGH (ref 65–99)
Potassium: 4.4 mmol/L (ref 3.5–5.2)
Sodium: 137 mmol/L (ref 134–144)
Total Protein: 7.1 g/dL (ref 6.0–8.5)
eGFR: 69 mL/min/{1.73_m2} (ref >59)

## 2020-10-16 LAB — TSH+FREE T4
Free T4: 1.38 ng/dL (ref 0.82–1.77)
TSH: 2.14 u[IU]/mL (ref 0.450–4.500)

## 2020-10-16 LAB — LIPID PANEL WITH LDL/HDL RATIO
Cholesterol, Total: 195 mg/dL (ref 100–199)
HDL: 63 mg/dL (ref >39)
LDL Chol Calc (NIH): 120 mg/dL — ABNORMAL HIGH (ref 0–99)
LDL/HDL Ratio: 1.9 ratio (ref 0.0–3.2)
Triglycerides: 68 mg/dL (ref 0–149)
VLDL Cholesterol Cal: 12 mg/dL (ref 5–40)

## 2020-10-16 LAB — VITAMIN D 1,25 DIHYDROXY
Vitamin D 1, 25 (OH)2 Total: 30 pg/mL
Vitamin D2 1, 25 (OH)2: <10 pg/mL
Vitamin D3 1, 25 (OH)2: 21 pg/mL

## 2020-10-16 LAB — VITAMIN B12: Vitamin B-12: 718 pg/mL (ref 232–1245)

## 2020-11-03 ENCOUNTER — Encounter: Payer: 59 | Attending: Surgery | Admitting: Skilled Nursing Facility1

## 2020-11-03 ENCOUNTER — Other Ambulatory Visit: Payer: Self-pay

## 2020-11-03 DIAGNOSIS — Z6841 Body Mass Index (BMI) 40.0 and over, adult: Secondary | ICD-10-CM | POA: Diagnosis present

## 2020-11-03 DIAGNOSIS — E1122 Type 2 diabetes mellitus with diabetic chronic kidney disease: Secondary | ICD-10-CM | POA: Insufficient documentation

## 2020-11-03 DIAGNOSIS — N182 Chronic kidney disease, stage 2 (mild): Secondary | ICD-10-CM | POA: Diagnosis present

## 2020-11-04 ENCOUNTER — Encounter: Payer: Self-pay | Admitting: Skilled Nursing Facility1

## 2020-11-04 NOTE — Progress Notes (Signed)
Bariatric Class:  Appt start time: 6:00 end time: 7:00  12 Month Post-Operative Nutrition Class  Patient was seen on 11/04/2020 for Post-Operative Nutrition education at [facility].   Surgery date: 10/29/2019 Surgery type: sleeve   Body Composition Scale 11/04/2020  Total Body Fat % 40.3  Visceral Fat 12  Fat-Free Mass % 59.6   Total Body Water % 44.3   Muscle-Mass lbs 30.5  Body Fat Displacement          Torso  lbs 50.1         Left Leg  lbs 10         Right Leg  lbs 10         Left Arm  lbs 5         Right Arm   lbs 5    The following the learning objectives were met by the patient during this course:  Review of TANITA scale information  Share and discuss bariatric surgery successes and non-scale victories  Identifies Phase VII (Maintenance Phase) Dietary Goals which will be lifelong  Identifies appropriate sources of fluids, proteins, non-starchy vegetables, and complex carbohydrates  Identifies well-balanced meals  Identifies portion control   Identifies appropriate multivitamin and calcium sources post-operatively  Describes the need for physical activity post-operatively and will follow MD recommendations  Identifies and describes SMART goals   Creates at least 2 SMART goals to begin immediately  States when to call healthcare provider regarding medication questions or post-operative complications  Teaching method utilized: Visual & Auditory  Demonstrated degree of understanding via: Teach Back  Readiness Level: Action Barriers to learning/adherence to lifestyle change: None Identified  Handouts given during class include:  Phase VII: Maintenance Phase-Lifelong  Follow-Up Plan: Patient will follow-up at [place] for on-going post-op nutrition visits.

## 2020-11-20 ENCOUNTER — Encounter: Payer: Self-pay | Admitting: Obstetrics and Gynecology

## 2020-11-20 ENCOUNTER — Other Ambulatory Visit: Payer: Self-pay

## 2020-11-20 ENCOUNTER — Ambulatory Visit (INDEPENDENT_AMBULATORY_CARE_PROVIDER_SITE_OTHER): Payer: 59 | Admitting: Obstetrics and Gynecology

## 2020-11-20 VITALS — BP 136/84 | Ht 64.0 in | Wt 203.0 lb

## 2020-11-20 DIAGNOSIS — N921 Excessive and frequent menstruation with irregular cycle: Secondary | ICD-10-CM | POA: Diagnosis not present

## 2020-11-20 MED ORDER — MEDROXYPROGESTERONE ACETATE 10 MG PO TABS: 10.0000 mg | ORAL_TABLET | Freq: Every day | ORAL | 11 refills | Status: DC

## 2020-11-20 NOTE — Progress Notes (Signed)
Obstetrics & Gynecology Office Visit    Chief Complaint  Patient presents with  . Menstrual Problem   History of Present Illness: 55 y.o. G0P0000 female who presents with bleeding that started 3 weeks ago. The bleeding started as spotting.  A week ago she started having heavier bleeding, passing clots ("pretty big ones").  She soaked an overnight pad in 30 minutes. The bleeding is not as heavy now, still passing smaller clots.    She was taking POPs and she stopped bleeding all the time.  She had regular periods for a while, then she had no periods for a month or two, then this most recent thing happened.   Last pap smear 06/28/2018 - NILM, HPV negative Endometrial biopsy 04/2020: negative (history of ablation)  Past Medical History:  Diagnosis Date  . Abnormal uterine bleeding (AUB)   . Anemia   . Chronic kidney disease   . DM (diabetes mellitus) ([place])   . Fibroid   . GERD (gastroesophageal reflux disease)   . Headache    h/o  . Hyperlipidemia   . Hyperlipidemia   . Hypertension   . Metrorrhagia   . Obesity    bmi 54  . PCOS (polycystic ovarian syndrome)   . S/P endometrial ablation   . Vaginal Pap smear, abnormal 2009   pos hvp    Past Surgical History:  Procedure Laterality Date  . COLONOSCOPY    . DILATATION & CURETTAGE/HYSTEROSCOPY WITH MYOSURE N/A 11/28/2014   Procedure: DILATATION & CURETTAGE/HYSTEROSCOPY WITH MYOSURE;  Surgeon: Will Bonnet, MD;  Location: [facility] ORS;  Service: Gynecology;  Laterality: N/A;  . DILATION AND CURETTAGE OF UTERUS    . LAPAROSCOPIC GASTRIC SLEEVE RESECTION N/A 10/29/2019   Procedure: LAPAROSCOPIC GASTRIC SLEEVE RESECTION, Upper Endo, ERAS Pathway;  Surgeon: Johnathan Hausen, MD;  Location: [facility];  Service: General;  Laterality: N/A;  . NOVASURE ABLATION    . TONSILLECTOMY      Gynecologic History: Patient's last menstrual period was 10/28/2020.  Obstetric History: G0P0000  Family History  Problem Relation Age of Onset  .  Colon cancer Mother 29  . Colon cancer Father 55  . Breast cancer Maternal Grandmother 69    Social History   Socioeconomic History  . Marital status: Single    Spouse name: Not on file  . Number of children: Not on file  . Years of education: Not on file  . Highest education level: Not on file  Occupational History  . Not on file  Tobacco Use  . Smoking status: Never Smoker  . Smokeless tobacco: Never Used  Vaping Use  . Vaping Use: Never used  Substance and Sexual Activity  . Alcohol use: Not Currently    Comment: socially  . Drug use: No  . Sexual activity: Yes    Partners: Male    Birth control/protection: None  Other Topics Concern  . Not on file  Social History Narrative  . Not on file   Social Determinants of Health   Financial Resource Strain: Not on file  Food Insecurity: Not on file  Transportation Needs: Not on file  Physical Activity: Not on file  Stress: Not on file  Social Connections: Not on file  Intimate Partner Violence: Not on file    No Known Allergies  Prior to Admission medications   Medication Sig Start Date End Date Taking? Authorizing Provider  CALCIUM PO Take 1 tablet by mouth 3 (three) times daily.   Yes [provider]  hydrochlorothiazide (HYDRODIURIL)  12.5 MG tablet Take 1 tablet (12.5 mg total) by mouth daily. 07/14/20  Yes Luiz Ochoa, NP  lisinopril (ZESTRIL) 5 MG tablet Take 1 tablet (5 mg total) by mouth daily. Patient not taking: Reported on 11/20/2020 07/14/20   Luiz Ochoa, NP  medroxyPROGESTERone (PROVERA) 10 MG tablet Take 1 tablet (10 mg total) by mouth daily for 10 days. 05/13/20 05/23/20  Will Bonnet, MD  Vitamin D, Ergocalciferol, (DRISDOL) 1.25 MG (50000 UNIT) CAPS capsule Take 1 capsule (50,000 Units total) by mouth every 7 (seven) days. Patient not taking: Reported on 11/20/2020 07/14/20   Luiz Ochoa, NP    Review of Systems  Constitutional: Negative.   HENT: Negative.   Eyes:  Negative.   Respiratory: Negative.   Cardiovascular: Negative.   Gastrointestinal: Negative.   Genitourinary: Negative.   Musculoskeletal: Negative.   Skin: Negative.   Neurological: Negative.   Psychiatric/Behavioral: Negative.      Physical Exam BP 136/84   Ht 5\' 4"  (1.626 m)   Wt 203 lb (92.1 kg)   LMP 10/28/2020   BMI 34.84 kg/m  Patient's last menstrual period was 10/28/2020. Physical Exam Constitutional:      General: She is not in acute distress.    Appearance: Normal appearance.  Genitourinary:     Bladder and urethral meatus normal.     No lesions in the vagina.     Right Labia: No rash, tenderness, lesions or skin changes.    Left Labia: No tenderness, lesions, skin changes or rash.    No inguinal adenopathy present in the right or left side.    Pelvic Tanner Score: 5/5.    No vaginal discharge, erythema, bleeding or ulceration.     No vaginal prolapse present.     Right Adnexa: not tender, not full and no mass present.    Left Adnexa: not tender, not full and no mass present.    No cervical motion tenderness, discharge, friability, lesion or polyp.     Uterus is not enlarged, fixed or tender.     Uterus is anteverted.  HENT:     Head: Normocephalic and atraumatic.  Eyes:     General: No scleral icterus.    Conjunctiva/sclera: Conjunctivae normal.  Lymphadenopathy:     Lower Body: No right inguinal adenopathy. No left inguinal adenopathy.  Neurological:     General: No focal deficit present.     Mental Status: She is alert and oriented to person, place, and time.     Cranial Nerves: No cranial nerve deficit.  Psychiatric:        Mood and Affect: Mood normal.        Behavior: Behavior normal.        Judgment: Judgment normal.     Female chaperone present for pelvic and breast  portions of the physical exam  Assessment: 55 y.o. G0P0000 female here for  1. Menorrhagia with irregular cycle      Plan: Problem List Items Addressed This Visit   None    Visit Diagnoses    Menorrhagia with irregular cycle    -  Primary   Relevant Medications   medroxyPROGESTERone (PROVERA) 10 MG tablet   Other Relevant Orders   US PELVIC COMPLETE WITH TRANSVAGINAL     Discussed that bleeding is abnormal.  We will obtain a pelvic ultrasound.  She had a negative endometrial biopsy 7 months ago.  However, she has a history of endometrial ablation.  We discussed that because of her  endometrial ablation, I cannot guarantee that an endometrial biopsy would be 100% accurate.  I offered treatment with Provera monthly to control the regularity of her periods.  Her bleeding level should taper off and eventually stop as she goes into menopause with this regimen.  I would like for her to be on the regimen for as little time as possible.  If her bleeding does not respond to this medication, we will likely need to perform a surgical intervention for definitive treatment, given that hysteroscopy may be difficult due to her history of ablation.  She voiced understanding and agreement to this plan.  She also voiced understanding that I cannot guarantee she did not have cancer.  I did offer as a primary intervention hysterectomy for definitive management and diagnosis.  She would like to decline at this point, given the risk outlined above.  A total of 24 minutes were spent face-to-face with the patient as well as preparation, review, communication, and documentation during this encounter.  t  Prentice Docker, MD 11/20/2020 2:24 PM

## 2020-12-31 ENCOUNTER — Ambulatory Visit
Admission: RE | Admit: 2020-12-31 | Discharge: 2020-12-31 | Disposition: A | Discharge status: Home / Self Care | Payer: 59 | Source: Ambulatory Visit | Attending: Obstetrics and Gynecology | Admitting: Obstetrics and Gynecology

## 2020-12-31 ENCOUNTER — Other Ambulatory Visit: Payer: Self-pay

## 2020-12-31 DIAGNOSIS — N921 Excessive and frequent menstruation with irregular cycle: Secondary | ICD-10-CM | POA: Diagnosis present

## 2021-01-01 ENCOUNTER — Ambulatory Visit (INDEPENDENT_AMBULATORY_CARE_PROVIDER_SITE_OTHER): Payer: 59 | Admitting: Obstetrics and Gynecology

## 2021-01-01 ENCOUNTER — Encounter: Payer: Self-pay | Admitting: Obstetrics and Gynecology

## 2021-01-01 VITALS — BP 110/70 | Ht 64.0 in | Wt 202.0 lb

## 2021-01-01 DIAGNOSIS — N921 Excessive and frequent menstruation with irregular cycle: Secondary | ICD-10-CM | POA: Diagnosis not present

## 2021-01-01 DIAGNOSIS — D251 Intramural leiomyoma of uterus: Secondary | ICD-10-CM

## 2021-01-01 DIAGNOSIS — D250 Submucous leiomyoma of uterus: Secondary | ICD-10-CM

## 2021-01-01 DIAGNOSIS — N9489 Other specified conditions associated with female genital organs and menstrual cycle: Secondary | ICD-10-CM

## 2021-01-01 NOTE — Progress Notes (Signed)
Gynecology Ultrasound Follow Up  Chief Complaint:  Chief Complaint  Patient presents with   Follow-up    U/S  Ultrasound for heavy bleeding   History of Present Illness: Patient is a 55 y.o. female who presents today for ultrasound evaluation of the above .  Ultrasound demonstrates the following findings Adnexa: No masses  Uterus: anteverted with endometrial stripe  2.64 cm, measures 9.5 x 5.4 x 7.1 cm Additional: Three fibroids Fibroid 1 (SM): 2.97 x 4 x 2.5 cm Fibroid 2 (Right body, IM): 2.2 x 3.05 x 2.5 cm Fibroid 3 (Fundal, IM): 1.75 x 1.5 x 1.5 cm  She has a history of endometrial ablation and in 2016 she underwent a hysteroscopy, D&C, with submucosal myomectomy.   Since her last visit her bleeding has been present, but not as heavy.  She had bleeding between 6/4 and 6/14.    Past Medical History:  Diagnosis Date   Abnormal uterine bleeding (AUB)    Anemia    Chronic kidney disease    DM (diabetes mellitus) ([place])    Fibroid    GERD (gastroesophageal reflux disease)    Headache    h/o   Hyperlipidemia    Hyperlipidemia    Hypertension    Metrorrhagia    Obesity    bmi 54   PCOS (polycystic ovarian syndrome)    S/P endometrial ablation    Vaginal Pap smear, abnormal 2009   pos hvp    Past Surgical History:  Procedure Laterality Date   COLONOSCOPY     DILATATION & CURETTAGE/HYSTEROSCOPY WITH MYOSURE N/A 11/28/2014   Procedure: DILATATION & CURETTAGE/HYSTEROSCOPY WITH MYOSURE;  Surgeon: Will Bonnet, MD;  Location: [facility] ORS;  Service: Gynecology;  Laterality: N/A;   DILATION AND CURETTAGE OF UTERUS     LAPAROSCOPIC GASTRIC SLEEVE RESECTION N/A 10/29/2019   Procedure: LAPAROSCOPIC GASTRIC SLEEVE RESECTION, Upper Endo, ERAS Pathway;  Surgeon: Johnathan Hausen, MD;  Location: [facility];  Service: General;  Laterality: N/A;   NOVASURE ABLATION     TONSILLECTOMY      Family History  Problem Relation Age of Onset   Colon cancer Mother 66   Colon cancer  Father 35   Breast cancer Maternal Grandmother 39    Social History   Socioeconomic History   Marital status: Single    Spouse name: Not on file   Number of children: Not on file   Years of education: Not on file   Highest education level: Not on file  Occupational History   Not on file  Tobacco Use   Smoking status: Never   Smokeless tobacco: Never  Vaping Use   Vaping Use: Never used  Substance and Sexual Activity   Alcohol use: Not Currently    Comment: socially   Drug use: No   Sexual activity: Yes    Partners: Male    Birth control/protection: None  Other Topics Concern   Not on file  Social History Narrative   Not on file   Social Determinants of Health   Financial Resource Strain: Not on file  Food Insecurity: Not on file  Transportation Needs: Not on file  Physical Activity: Not on file  Stress: Not on file  Social Connections: Not on file  Intimate Partner Violence: Not on file    No Known Allergies  Prior to Admission medications   Medication Sig Start Date End Date Taking? Authorizing Provider  CALCIUM PO Take 1 tablet by mouth 3 (three) times daily.   Yes [provider]  hydrochlorothiazide (HYDRODIURIL) 12.5 MG tablet Take 1 tablet (12.5 mg total) by mouth daily. 07/14/20  Yes Luiz Ochoa, NP  medroxyPROGESTERone (PROVERA) 10 MG tablet Take 1 tablet (10 mg total) by mouth daily for 10 days. 11/20/20 11/30/20  Will Bonnet, MD    Physical Exam BP 110/70   Ht 5\' 4"  (1.626 m)   Wt 202 lb (91.6 kg)   BMI 34.67 kg/m    General: NAD HEENT: normocephalic, anicteric Pulmonary: No increased work of breathing Extremities: no edema, erythema, or tenderness Neurologic: Grossly intact, normal gait Psychiatric: mood appropriate, affect full  Imaging Results US PELVIC COMPLETE WITH TRANSVAGINAL  Result Date: 01/01/2021 CLINICAL DATA:  Menorrhagia.  Irregular cycles.  Ablation 2009. EXAM: TRANSABDOMINAL AND TRANSVAGINAL ULTRASOUND OF  PELVIS TECHNIQUE: Both transabdominal and transvaginal ultrasound examinations of the pelvis were performed. Transabdominal technique was performed for global imaging of the pelvis including uterus, ovaries, adnexal regions, and pelvic cul-de-sac. It was necessary to proceed with endovaginal exam following the transabdominal exam to visualize the endometrium and ovaries. COMPARISON:  CT scan March 01, 2006 FINDINGS: Uterus Measurements: 9.5 x 5.4 x 7.1 cm = volume: 192 mL. The uterus is anteverted. There are at least 2 fibroids in the uterus. A fibroid in the right uterine body measures 2.2 x 3.1 x 2.5 cm. A fibroid at the fundus measures 1.7 x 1.5 x 1.5 cm. A third mass is centered in the endometrial canal measuring 3.0 x 4.0 x 2.5 cm. This endometrial canal based mass could represent a sub endometrial fibroid. A mass arising in the endometrium is not excluded on today's study. Endometrium Thickness: 26.4 mm. There is a mass centered in the endometrial canal measuring 3.0 x 4.0 x 2.5 cm. While this could represent a sub endometrial fibroid, it would be difficult to exclude an endometrial based mass on this study. Right ovary Measurements: 3.3 x 2.0 x 3.5 cm = volume: 12.2 mL. Normal appearance/no adnexal mass. Left ovary Measurements: 3.3 x 1.9 x 2.4 cm = volume: 7.8 mL. Normal appearance/no adnexal mass. Other findings No abnormal free fluid. IMPRESSION: 1. There is a 3.0 x 4.0 x 2.5 cm mass centered in the endometrial canal. While this could represent a sub endometrial fibroid, an endometrial based mass is not excluded. Recommend gynecologic consultation with pelvic MR for further evaluation. 2. The endometrial stripe thickness measured 26.4 mm. Evaluation of the endometrium is limited due to the mass centered in the endometrial canal. Recommend attention on MRI. 3. There are at least 2 fibroids in the uterus as above. These results will be called to the ordering clinician or representative by the Radiologist  Assistant, and communication documented in the PACS or [employer]. Electronically Signed   By: Dorise Bullion III M.D   On: 01/01/2021 10:52     Assessment: 55 y.o. G0P0000  1. Menorrhagia with irregular cycle   2. Intramural and submucous leiomyoma of uterus   3. Endometrial mass      Plan: Problem List Items Addressed This Visit   None Visit Diagnoses     Menorrhagia with irregular cycle    -  Primary   Intramural and submucous leiomyoma of uterus       Endometrial mass           We had a long discussion today regarding the findings on ultrasound and the relationship to her current symptoms.  She has a history of a similar presentation back in 2016, where she underwent  a hysteroscopy with submucosal myomectomy.  We discussed that given her history of endometrial ablation, will be difficult to ensure sampling of the entire endometrium.  However, she did get benefit from her procedure in 2016 until this point.  Given the uncertain nature of the endometrial mass, which appears to most likely represent a fibroid, she elects to undergo a hysteroscopy with dilation and curettage, and removal of endometrial mass.  We will schedule this for soon.  All questions answered.  We also discussed that a hysterectomy would be the only way to permanently cure her symptoms.  She voiced understanding of this and would prefer hysteroscopy at this time with the attendant risk of missing a full assessment of her uterus, as discussed above.  A total of 24 minutes were spent face-to-face with the patient as well as preparation, review, communication, and documentation during this encounter.    Prentice Docker, MD, [facility], [facility] 01/01/2021 8:24 AM

## 2021-01-02 ENCOUNTER — Encounter: Payer: Self-pay | Admitting: Obstetrics and Gynecology

## 2021-01-05 ENCOUNTER — Encounter: Payer: Self-pay | Admitting: Nurse Practitioner

## 2021-01-05 ENCOUNTER — Ambulatory Visit: Payer: 59 | Admitting: Nurse Practitioner

## 2021-01-05 ENCOUNTER — Other Ambulatory Visit: Payer: Self-pay

## 2021-01-05 VITALS — BP 110/72 | HR 61 | Temp 98.4°F | Resp 16 | Ht 64.0 in | Wt 203.8 lb

## 2021-01-05 DIAGNOSIS — E7800 Pure hypercholesterolemia, unspecified: Secondary | ICD-10-CM

## 2021-01-05 DIAGNOSIS — I10 Essential (primary) hypertension: Secondary | ICD-10-CM | POA: Diagnosis not present

## 2021-01-05 DIAGNOSIS — E119 Type 2 diabetes mellitus without complications: Secondary | ICD-10-CM

## 2021-01-05 MED ORDER — ATORVASTATIN CALCIUM 10 MG PO TABS: 10.0000 mg | ORAL_TABLET | Freq: Every day | ORAL | 3 refills | Status: DC

## 2021-01-05 NOTE — Progress Notes (Signed)
[facility] [place], [place] [address]  Internal MEDICINE  Office Visit Note  Patient Name: Rachel Gordon  [id number]  [id number]  Date of Service: 01/15/2021  Chief Complaint  Patient presents with   Follow-up    Needs statin therapy,    HPI Jala presents for a follow up visit to discuss elevated cholesterol. She has a history of hypertension, diabetes, anemia, GERD, CKD, obesity and PCOS. Her blood pressure is 110/72 today and she takes hydrochlorothiazide 12.5 mg daily. Last A1C was 5.1 which is normal. A1C today was 5.4 which is still wnl.  No other questions or concerns today per patient.    Current Medication: Outpatient Encounter Medications as of 01/05/2021  Medication Sig   atorvastatin (LIPITOR) 10 MG tablet Take 1 tablet (10 mg total) by mouth daily.   CALCIUM PO Take 1 tablet by mouth 3 (three) times daily.   hydrochlorothiazide (HYDRODIURIL) 12.5 MG tablet Take 1 tablet (12.5 mg total) by mouth daily.   [DISCONTINUED] medroxyPROGESTERone (PROVERA) 10 MG tablet Take 1 tablet (10 mg total) by mouth daily for 10 days.   No facility-administered encounter medications on file as of 01/05/2021.    Surgical History: Past Surgical History:  Procedure Laterality Date   COLONOSCOPY     DILATATION & CURETTAGE/HYSTEROSCOPY WITH MYOSURE N/A 11/28/2014   Procedure: DILATATION & CURETTAGE/HYSTEROSCOPY WITH MYOSURE;  Surgeon: Will Bonnet, MD;  Location: [facility] ORS;  Service: Gynecology;  Laterality: N/A;   DILATION AND CURETTAGE OF UTERUS     LAPAROSCOPIC GASTRIC SLEEVE RESECTION N/A 10/29/2019   Procedure: LAPAROSCOPIC GASTRIC SLEEVE RESECTION, Upper Endo, ERAS Pathway;  Surgeon: Johnathan Hausen, MD;  Location: [facility];  Service: General;  Laterality: N/A;   NOVASURE ABLATION     TONSILLECTOMY      Medical History: Past Medical History:  Diagnosis Date   Abnormal uterine bleeding (AUB)    Anemia    Chronic kidney disease    DM (diabetes  mellitus) ([place])    Fibroid    GERD (gastroesophageal reflux disease)    Headache    h/o   Hyperlipidemia    Hyperlipidemia    Hypertension    Metrorrhagia    Obesity    bmi 54   PCOS (polycystic ovarian syndrome)    S/P endometrial ablation    Vaginal Pap smear, abnormal 2009   pos hvp    Family History: Family History  Problem Relation Age of Onset   Colon cancer Mother 23   Colon cancer Father 66   Breast cancer Maternal Grandmother 84    Social History   Socioeconomic History   Marital status: Single    Spouse name: Not on file   Number of children: Not on file   Years of education: Not on file   Highest education level: Not on file  Occupational History   Not on file  Tobacco Use   Smoking status: Never   Smokeless tobacco: Never  Vaping Use   Vaping Use: Never used  Substance and Sexual Activity   Alcohol use: Not Currently    Comment: socially   Drug use: No   Sexual activity: Yes    Partners: Male    Birth control/protection: None  Other Topics Concern   Not on file  Social History Narrative   Not on file   Social Determinants of Health   Financial Resource Strain: Not on file  Food Insecurity: Not on file  Transportation Needs: Not on file  Physical  Activity: Not on file  Stress: Not on file  Social Connections: Not on file  Intimate Partner Violence: Not on file      Review of Systems  Constitutional:  Negative for chills, fatigue and unexpected weight change.  HENT:  Negative for congestion, rhinorrhea, sneezing and sore throat.   Eyes:  Negative for redness.  Respiratory:  Negative for cough, chest tightness and shortness of breath.   Cardiovascular:  Negative for chest pain and palpitations.  Gastrointestinal:  Negative for abdominal pain, constipation, diarrhea, nausea and vomiting.  Genitourinary:  Negative for dysuria and frequency.  Musculoskeletal:  Negative for arthralgias, back pain, joint swelling and neck pain.  Skin:   Negative for rash.  Neurological: Negative.  Negative for tremors and numbness.  Hematological:  Negative for adenopathy. Does not bruise/bleed easily.  Psychiatric/Behavioral:  Negative for behavioral problems (Depression), sleep disturbance and suicidal ideas. The patient is not nervous/anxious.    Vital Signs: BP 110/72   Pulse 61   Temp 98.4 F (36.9 C)   Resp 16   Ht 5\' 4"  (1.626 m)   Wt 203 lb 12.8 oz (92.4 kg)   SpO2 98%   BMI 34.98 kg/m    Physical Exam Constitutional:      General: She is not in acute distress.    Appearance: Normal appearance. She is obese. She is not ill-appearing.  HENT:     Head: Normocephalic and atraumatic.  Cardiovascular:     Rate and Rhythm: Normal rate and regular rhythm.     Pulses: Normal pulses.     Heart sounds: Normal heart sounds.  Pulmonary:     Effort: Pulmonary effort is normal.     Breath sounds: Normal breath sounds.  Skin:    General: Skin is warm and dry.     Capillary Refill: Capillary refill takes less than 2 seconds.  Neurological:     Mental Status: She is alert and oriented to person, place, and time.  Psychiatric:        Mood and Affect: Mood normal.        Behavior: Behavior normal.    Assessment/Plan: 1. Type 2 diabetes mellitus without complication, without long-term current use of insulin (HCC) A1C is 5.4 today, which is wnl. She does not take any medication for diabetes and is currently diet controlled. Will recheck A1C in 6 months.  - POCT glycosylated hemoglobin (Hb A1C)  2. Essential hypertension Blood pressure is well controlled with hydrochlorothiazide 12.5 mg daily. No changes. No  refills needed.   3. Pure hypercholesterolemia LDL is elevated at 120. Moderate intensity statin therapy is recommended. Atorvastatin 10 mg daily ordered.  - atorvastatin (LIPITOR) 10 MG tablet; Take 1 tablet (10 mg total) by mouth daily.  Dispense: 90 tablet; Refill: 3   General Counseling: Tomeca verbalizes  understanding of the findings of todays visit and agrees with plan of treatment. I have discussed any further diagnostic evaluation that may be needed or ordered today. We also reviewed her medications today. she has been encouraged to call the office with any questions or concerns that should arise related to todays visit.    Orders Placed This Encounter  Procedures   POCT glycosylated hemoglobin (Hb A1C)    Meds ordered this encounter  Medications   atorvastatin (LIPITOR) 10 MG tablet    Sig: Take 1 tablet (10 mg total) by mouth daily.    Dispense:  90 tablet    Refill:  3    Return in about  4 months (around 05/07/2021) for CPE, Dylan Ruotolo PCP.   Total time spent:30 Minutes Time spent includes review of chart, medications, test results, and follow up plan with the patient.   [place] Controlled Substance Database was reviewed by me.  This patient was seen by Jonetta Osgood, FNP-C in collaboration with Dr. Clayborn Bigness as a part of collaborative care agreement.   Whyatt Klinger R. Valetta Fuller, MSN, FNP-C Internal medicine

## 2021-01-11 ENCOUNTER — Telehealth: Payer: Self-pay

## 2021-01-11 NOTE — Telephone Encounter (Signed)
-----   Message from Will Bonnet, MD sent at 01/02/2021 12:13 PM EDT ----- Regarding: Schedule surgery Surgery Booking Request Patient Full Name:  Rachel Gordon  MRN: [id number]  DOB: [birth year]  Surgeon: Prentice Docker, MD  Requested Surgery Date and Time: TBD Primary Diagnosis AND Code:  1) Menorrhagia with irregular cycle [N92.1] 2) Endometrial mass [N94.89] 3) Intramural and submucous leiomyoma of uterus [ D25.1, D25.0] Secondary Diagnosis and Code:  Surgical Procedure: Hysteroscopy, dilation and curettage, removal of endometrial mass RNFA Requested?: No L&D Notification: No Admission Status: same day surgery Length of Surgery: 50 min Special Case Needs: Yes, MyoSure with Reach device H&P: Yes Phone Interview???:  Yes Interpreter: No Medical Clearance:  No Special Scheduling Instructions: No Any known health/anesthesia issues, diabetes, sleep apnea, latex allergy, defibrillator/pacemaker?: Yes, DM Acuity: P3   (P1 highest, P2 delay may cause harm, P3 low, elective gyn, P4 lowest)

## 2021-01-11 NOTE — Telephone Encounter (Signed)
Called patient to schedule Hysteroscopy D&C w removal of endometrial mass  DOS 7/12  H&P 7/7 @ 8:50 in [place] phone call appointment to be requested - date and time will be included on H&P paper work. Also all appointments will be updated on pt MyChart. Explained that this appointment has a call window. Based on the time scheduled will indicate if the call will be received within a 4 hour window before 1:00 or after.  Advised that pt may also receive calls from the hospital pharmacy and pre-service center.  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 No secondary insurance.

## 2021-01-15 LAB — POCT GLYCOSYLATED HEMOGLOBIN (HGB A1C): Hemoglobin A1C: 5.4 % (ref 4.0–5.6)

## 2021-01-21 ENCOUNTER — Other Ambulatory Visit: Payer: Self-pay

## 2021-01-21 ENCOUNTER — Ambulatory Visit (INDEPENDENT_AMBULATORY_CARE_PROVIDER_SITE_OTHER): Payer: 59 | Admitting: Obstetrics and Gynecology

## 2021-01-21 ENCOUNTER — Encounter: Payer: Self-pay | Admitting: Obstetrics and Gynecology

## 2021-01-21 ENCOUNTER — Encounter
Admission: RE | Admit: 2021-01-21 | Discharge: 2021-01-21 | Disposition: A | Discharge status: Home / Self Care | Payer: 59 | Source: Ambulatory Visit | Attending: Obstetrics and Gynecology | Admitting: Obstetrics and Gynecology

## 2021-01-21 VITALS — BP 123/82 | Resp 16 | Ht 65.0 in | Wt 205.2 lb

## 2021-01-21 DIAGNOSIS — N9489 Other specified conditions associated with female genital organs and menstrual cycle: Secondary | ICD-10-CM

## 2021-01-21 DIAGNOSIS — D251 Intramural leiomyoma of uterus: Secondary | ICD-10-CM

## 2021-01-21 DIAGNOSIS — D250 Submucous leiomyoma of uterus: Secondary | ICD-10-CM

## 2021-01-21 DIAGNOSIS — N921 Excessive and frequent menstruation with irregular cycle: Secondary | ICD-10-CM

## 2021-01-21 HISTORY — DX: Sleep apnea, unspecified: G47.30

## 2021-01-21 HISTORY — DX: Personal history of other diseases of the digestive system: Z87.19

## 2021-01-21 NOTE — H&P (View-Only) (Signed)
Preoperative History and Physical  Rachel Gordon is a 55 y.o. G0P0000 here for surgical management of menorrhagia with irregular cycle, intramural and submucous leiomyoma of uterus, and endometrial mass.   No significant preoperative concerns.  History of Present Illness: 55 y.o. G0P0000 female with increased heavy bleeding.    Ultrasound demonstrates the following findings Adnexa: No masses  Uterus: anteverted with endometrial stripe  2.64 cm, measures 9.5 x 5.4 x 7.1 cm Additional: Three fibroids Fibroid 1 (SM): 2.97 x 4 x 2.5 cm Fibroid 2 (Right body, IM): 2.2 x 3.05 x 2.5 cm Fibroid 3 (Fundal, IM): 1.75 x 1.5 x 1.5 cm   She has a history of endometrial ablation and in 2016 she underwent a hysteroscopy, D&C, with submucosal myomectomy.  Last pap smear 06/28/2018 - NILM, HPV negative Endometrial biopsy 04/2020: negative (history of ablation, though hysteroscopy with resection of fibroid done in 2016)  Proposed surgery: Hysteroscopy, dilation and curettage, endometrial mass resection.   Past Medical History:  Diagnosis Date   Abnormal uterine bleeding (AUB)    Anemia    Chronic kidney disease    DM (diabetes mellitus) ([place])    Fibroid    GERD (gastroesophageal reflux disease)    Headache    h/o   Hyperlipidemia    Hyperlipidemia    Hypertension    Metrorrhagia    Obesity    bmi 54   PCOS (polycystic ovarian syndrome)    S/P endometrial ablation    Vaginal Pap smear, abnormal 2009   pos hvp   Past Surgical History:  Procedure Laterality Date   COLONOSCOPY     DILATATION & CURETTAGE/HYSTEROSCOPY WITH MYOSURE N/A 11/28/2014   Procedure: DILATATION & CURETTAGE/HYSTEROSCOPY WITH MYOSURE;  Surgeon: Will Bonnet, MD;  Location: [facility] ORS;  Service: Gynecology;  Laterality: N/A;   DILATION AND CURETTAGE OF UTERUS     LAPAROSCOPIC GASTRIC SLEEVE RESECTION N/A 10/29/2019   Procedure: LAPAROSCOPIC GASTRIC SLEEVE RESECTION, Upper Endo, ERAS Pathway;  Surgeon: Johnathan Hausen, MD;  Location: [facility];  Service: General;  Laterality: N/A;   NOVASURE ABLATION     TONSILLECTOMY     OB History  Gravida Para Term Preterm AB Living  0 0 0 0 0 0  SAB IAB Ectopic Multiple Live Births  0 0 0 0 0  Patient denies any other pertinent gynecologic issues.   Current Outpatient Medications on File Prior to Visit  Medication Sig Dispense Refill   atorvastatin (LIPITOR) 10 MG tablet Take 1 tablet (10 mg total) by mouth daily. 90 tablet 3   CALCIUM PO Take 1 tablet by mouth 3 (three) times daily.     hydrochlorothiazide (HYDRODIURIL) 12.5 MG tablet Take 1 tablet (12.5 mg total) by mouth daily. 90 tablet 2   Multiple Vitamins-Minerals (MULTIVITAMIN WITH MINERALS) tablet Take 1 tablet by mouth daily. Bariatric     No current facility-administered medications on file prior to visit.   No Known Allergies  Social History:   reports that she has never smoked. She has never used smokeless tobacco. She reports previous alcohol use. She reports that she does not use drugs.  Family History  Problem Relation Age of Onset   Colon cancer Mother 46   Colon cancer Father 23   Breast cancer Maternal Grandmother 60    Review of Systems: Noncontributory  PHYSICAL EXAM: Blood pressure 123/82, resp. rate 16, height 5\' 5"  (1.651 m), weight 205 lb 3.2 oz (93.1 kg), SpO2 98 %. CONSTITUTIONAL: Well-developed, well-nourished female in no  acute distress.  HENT:  Normocephalic, atraumatic, External right and left ear normal. Oropharynx is clear and moist EYES: Conjunctivae and EOM are normal. Pupils are equal, round, and reactive to light. No scleral icterus.  NECK: Normal range of motion, supple, no masses SKIN: Skin is warm and dry. No rash noted. Not diaphoretic. No erythema. No pallor. [place]: Alert and oriented to person, place, and time. Normal reflexes, muscle tone coordination. No cranial nerve deficit noted. PSYCHIATRIC: Normal mood and affect. Normal behavior. Normal  judgment and thought content. CARDIOVASCULAR: Normal heart rate noted, regular rhythm RESPIRATORY: Effort and breath sounds normal, no problems with respiration noted ABDOMEN: Soft, nontender, nondistended. PELVIC: Deferred MUSCULOSKELETAL: Normal range of motion. No edema and no tenderness. 2+ distal pulses.  Labs: Results for orders placed or performed in visit on 01/05/21 (from the past 336 hour(s))  POCT glycosylated hemoglobin (Hb A1C)   Collection Time: 01/15/21  1:31 PM  Result Value Ref Range   Hemoglobin A1C 5.4 4.0 - 5.6 %   HbA1c POC (<> result, manual entry)     HbA1c, POC (prediabetic range)     HbA1c, POC (controlled diabetic range)      Imaging Studies: US PELVIC COMPLETE WITH TRANSVAGINAL  Result Date: 01/01/2021 CLINICAL DATA:  Menorrhagia.  Irregular cycles.  Ablation 2009. EXAM: TRANSABDOMINAL AND TRANSVAGINAL ULTRASOUND OF PELVIS TECHNIQUE: Both transabdominal and transvaginal ultrasound examinations of the pelvis were performed. Transabdominal technique was performed for global imaging of the pelvis including uterus, ovaries, adnexal regions, and pelvic cul-de-sac. It was necessary to proceed with endovaginal exam following the transabdominal exam to visualize the endometrium and ovaries. COMPARISON:  CT scan March 01, 2006 FINDINGS: Uterus Measurements: 9.5 x 5.4 x 7.1 cm = volume: 192 mL. The uterus is anteverted. There are at least 2 fibroids in the uterus. A fibroid in the right uterine body measures 2.2 x 3.1 x 2.5 cm. A fibroid at the fundus measures 1.7 x 1.5 x 1.5 cm. A third mass is centered in the endometrial canal measuring 3.0 x 4.0 x 2.5 cm. This endometrial canal based mass could represent a sub endometrial fibroid. A mass arising in the endometrium is not excluded on today's study. Endometrium Thickness: 26.4 mm. There is a mass centered in the endometrial canal measuring 3.0 x 4.0 x 2.5 cm. While this could represent a sub endometrial fibroid, it would be  difficult to exclude an endometrial based mass on this study. Right ovary Measurements: 3.3 x 2.0 x 3.5 cm = volume: 12.2 mL. Normal appearance/no adnexal mass. Left ovary Measurements: 3.3 x 1.9 x 2.4 cm = volume: 7.8 mL. Normal appearance/no adnexal mass. Other findings No abnormal free fluid. IMPRESSION: 1. There is a 3.0 x 4.0 x 2.5 cm mass centered in the endometrial canal. While this could represent a sub endometrial fibroid, an endometrial based mass is not excluded. Recommend gynecologic consultation with pelvic MR for further evaluation. 2. The endometrial stripe thickness measured 26.4 mm. Evaluation of the endometrium is limited due to the mass centered in the endometrial canal. Recommend attention on MRI. 3. There are at least 2 fibroids in the uterus as above. These results will be called to the ordering clinician or representative by the Radiologist Assistant, and communication documented in the PACS or [employer]. Electronically Signed   By: Dorise Bullion III M.D   On: 01/01/2021 10:52    Assessment:   ICD-10-CM   1. Menorrhagia with irregular cycle  N92.1     2. Intramural  and submucous leiomyoma of uterus  D25.1    D25.0     3. Endometrial mass  N94.89        Plan: Patient will undergo surgical management with the above surgery.   The risks of surgery were discussed in detail with the patient including but not limited to: bleeding which may require transfusion or reoperation; infection which may require antibiotics; injury to surrounding organs which may involve bowel, bladder, ureters ; need for additional procedures including laparoscopy or laparotomy; thromboembolic phenomenon, surgical site problems and other postoperative/anesthesia complications. Likelihood of success in alleviating the patient's condition was discussed. Routine postoperative instructions will be reviewed with the patient and her family in detail after surgery.  The patient concurred with the proposed  plan, giving informed verbal consent for the surgery.  Consents not available today. So, will sign them in pre-op.  She has no questions, as she has been through a similar procedure in the past. Preoperative prophylactic antibiotics, as indicated, and SCDs ordered on call to the OR.    Prentice Docker, MD 01/21/2021 9:11 AM

## 2021-01-21 NOTE — Patient Instructions (Signed)
Your procedure is scheduled on:01-26-21 Tuesday Report to the Registration Desk on the 1st floor of the [facility]-Then proceed to the 2nd floor Surgery Desk in the [place] To find out your arrival time, please call [phone] between 1PM - 3PM on:01-25-21 Monday  REMEMBER: Instructions that are not followed completely may result in serious medical risk, up to and including death; or upon the discretion of your surgeon and anesthesiologist your surgery may need to be rescheduled.  Do not eat food after midnight the night before surgery.  No gum chewing, lozengers or hard candies.  You may however, drink CLEAR liquids up to 2 hours before you are scheduled to arrive for your surgery. Do not drink anything within 2 hours of your scheduled arrival time.  Clear liquids include: - water  - apple juice without pulp - gatorade (not RED, PURPLE, OR BLUE) - black coffee or tea (Do NOT add milk or creamers to the coffee or tea) Do NOT drink anything that is not on this list.  In addition, your doctor has ordered for you to drink the provided  Ensure Pre-Surgery Clear Carbohydrate Drink  Drinking this carbohydrate drink up to two hours before surgery helps to reduce insulin resistance and improve patient outcomes. Please complete drinking 2 hours prior to scheduled arrival time.  TAKE THESE MEDICATIONS THE MORNING OF SURGERY WITH A SIP OF WATER: -Lipitor (Atorvastatin)  One week prior to surgery: Stop Anti-inflammatories (NSAIDS) such as Advil, Aleve, Ibuprofen, Motrin, Naproxen, Naprosyn and Aspirin based products such as Excedrin, Goodys Powder, BC Powder.You may however, continue to take Tylenol if needed for pain up until the day of surgery. Stop ANY OVER THE COUNTER supplements/vitamins NOW (01-21-21)until after surgery.  No Alcohol for 24 hours before or after surgery.  No Smoking including e-cigarettes for 24 hours prior to surgery.  No chewable tobacco products for at least 6  hours prior to surgery.  No nicotine patches on the day of surgery.  Do not use any "recreational" drugs for at least a week prior to your surgery.  Please be advised that the combination of cocaine and anesthesia may have negative outcomes, up to and including death. If you test positive for cocaine, your surgery will be cancelled.  On the morning of surgery brush your teeth with toothpaste and water, you may rinse your mouth with mouthwash if you wish. Do not swallow any toothpaste or mouthwash.  Do not wear jewelry, make-up, hairpins, clips or nail polish.  Do not wear lotions, powders, or perfumes.   Do not shave body from the neck down 48 hours prior to surgery just in case you cut yourself which could leave a site for infection.  Also, freshly shaved skin may become irritated if using the CHG soap.  Contact lenses, hearing aids and dentures may not be worn into surgery.  Do not bring valuables to the hospital. [facility] is not responsible for any missing/lost belongings or valuables.   Notify your doctor if there is any change in your medical condition (cold, fever, infection).  Wear comfortable clothing (specific to your surgery type) to the hospital.  After surgery, you can help prevent lung complications by doing breathing exercises.  Take deep breaths and cough every 1-2 hours. Your doctor may order a device called an Incentive Spirometer to help you take deep breaths. When coughing or sneezing, hold a pillow firmly against your incision with both hands. This is called "splinting." Doing this helps protect your incision. It  also decreases belly discomfort.  If you are being admitted to the hospital overnight, leave your suitcase in the car. After surgery it may be brought to your room.  If you are being discharged the day of surgery, you will not be allowed to drive home. You will need a responsible adult (18 years or older) to drive you home and stay with you that night.    If you are taking public transportation, you will need to have a responsible adult (18 years or older) with you. Please confirm with your physician that it is acceptable to use public transportation.   Please call the [place] Dept. at [phone] if you have any questions about these instructions.  Surgery Visitation Policy:  Patients undergoing a surgery or procedure may have one family member or support person with them as long as that person is not COVID-19 positive or experiencing its symptoms.  That person may remain in the waiting area during the procedure.  Inpatient Visitation:    Visiting hours are 7 a.m. to 8 p.m. Inpatients will be allowed two visitors daily. The visitors may change each day during the patient's stay. No visitors under the age of 38. Any visitor under the age of 48 must be accompanied by an adult. The visitor must pass COVID-19 screenings, use hand sanitizer when entering and exiting the patient's room and wear a mask at all times, including in the patient's room. Patients must also wear a mask when staff or their visitor are in the room. Masking is required regardless of vaccination status.

## 2021-01-21 NOTE — Progress Notes (Signed)
Preoperative History and Physical  Rachel Gordon is a 55 y.o. G0P0000 here for surgical management of menorrhagia with irregular cycle, intramural and submucous leiomyoma of uterus, and endometrial mass.   No significant preoperative concerns.  History of Present Illness: 55 y.o. G0P0000 female with increased heavy bleeding.    Ultrasound demonstrates the following findings Adnexa: No masses  Uterus: anteverted with endometrial stripe  2.64 cm, measures 9.5 x 5.4 x 7.1 cm Additional: Three fibroids Fibroid 1 (SM): 2.97 x 4 x 2.5 cm Fibroid 2 (Right body, IM): 2.2 x 3.05 x 2.5 cm Fibroid 3 (Fundal, IM): 1.75 x 1.5 x 1.5 cm   She has a history of endometrial ablation and in 2016 she underwent a hysteroscopy, D&C, with submucosal myomectomy.  Last pap smear 06/28/2018 - NILM, HPV negative Endometrial biopsy 04/2020: negative (history of ablation, though hysteroscopy with resection of fibroid done in 2016)  Proposed surgery: Hysteroscopy, dilation and curettage, endometrial mass resection.   Past Medical History:  Diagnosis Date   Abnormal uterine bleeding (AUB)    Anemia    Chronic kidney disease    DM (diabetes mellitus) ([place])    Fibroid    GERD (gastroesophageal reflux disease)    Headache    h/o   Hyperlipidemia    Hyperlipidemia    Hypertension    Metrorrhagia    Obesity    bmi 54   PCOS (polycystic ovarian syndrome)    S/P endometrial ablation    Vaginal Pap smear, abnormal 2009   pos hvp   Past Surgical History:  Procedure Laterality Date   COLONOSCOPY     DILATATION & CURETTAGE/HYSTEROSCOPY WITH MYOSURE N/A 11/28/2014   Procedure: DILATATION & CURETTAGE/HYSTEROSCOPY WITH MYOSURE;  Surgeon: Will Bonnet, MD;  Location: [facility] ORS;  Service: Gynecology;  Laterality: N/A;   DILATION AND CURETTAGE OF UTERUS     LAPAROSCOPIC GASTRIC SLEEVE RESECTION N/A 10/29/2019   Procedure: LAPAROSCOPIC GASTRIC SLEEVE RESECTION, Upper Endo, ERAS Pathway;  Surgeon: Johnathan Hausen, MD;  Location: [facility];  Service: General;  Laterality: N/A;   NOVASURE ABLATION     TONSILLECTOMY     OB History  Gravida Para Term Preterm AB Living  0 0 0 0 0 0  SAB IAB Ectopic Multiple Live Births  0 0 0 0 0  Patient denies any other pertinent gynecologic issues.   Current Outpatient Medications on File Prior to Visit  Medication Sig Dispense Refill   atorvastatin (LIPITOR) 10 MG tablet Take 1 tablet (10 mg total) by mouth daily. 90 tablet 3   CALCIUM PO Take 1 tablet by mouth 3 (three) times daily.     hydrochlorothiazide (HYDRODIURIL) 12.5 MG tablet Take 1 tablet (12.5 mg total) by mouth daily. 90 tablet 2   Multiple Vitamins-Minerals (MULTIVITAMIN WITH MINERALS) tablet Take 1 tablet by mouth daily. Bariatric     No current facility-administered medications on file prior to visit.   No Known Allergies  Social History:   reports that she has never smoked. She has never used smokeless tobacco. She reports previous alcohol use. She reports that she does not use drugs.  Family History  Problem Relation Age of Onset   Colon cancer Mother 95   Colon cancer Father 49   Breast cancer Maternal Grandmother 75    Review of Systems: Noncontributory  PHYSICAL EXAM: Blood pressure 123/82, resp. rate 16, height 5\' 5"  (1.651 m), weight 205 lb 3.2 oz (93.1 kg), SpO2 98 %. CONSTITUTIONAL: Well-developed, well-nourished female in no  acute distress.  HENT:  Normocephalic, atraumatic, External right and left ear normal. Oropharynx is clear and moist EYES: Conjunctivae and EOM are normal. Pupils are equal, round, and reactive to light. No scleral icterus.  NECK: Normal range of motion, supple, no masses SKIN: Skin is warm and dry. No rash noted. Not diaphoretic. No erythema. No pallor. [place]: Alert and oriented to person, place, and time. Normal reflexes, muscle tone coordination. No cranial nerve deficit noted. PSYCHIATRIC: Normal mood and affect. Normal behavior. Normal  judgment and thought content. CARDIOVASCULAR: Normal heart rate noted, regular rhythm RESPIRATORY: Effort and breath sounds normal, no problems with respiration noted ABDOMEN: Soft, nontender, nondistended. PELVIC: Deferred MUSCULOSKELETAL: Normal range of motion. No edema and no tenderness. 2+ distal pulses.  Labs: Results for orders placed or performed in visit on 01/05/21 (from the past 336 hour(s))  POCT glycosylated hemoglobin (Hb A1C)   Collection Time: 01/15/21  1:31 PM  Result Value Ref Range   Hemoglobin A1C 5.4 4.0 - 5.6 %   HbA1c POC (<> result, manual entry)     HbA1c, POC (prediabetic range)     HbA1c, POC (controlled diabetic range)      Imaging Studies: US PELVIC COMPLETE WITH TRANSVAGINAL  Result Date: 01/01/2021 CLINICAL DATA:  Menorrhagia.  Irregular cycles.  Ablation 2009. EXAM: TRANSABDOMINAL AND TRANSVAGINAL ULTRASOUND OF PELVIS TECHNIQUE: Both transabdominal and transvaginal ultrasound examinations of the pelvis were performed. Transabdominal technique was performed for global imaging of the pelvis including uterus, ovaries, adnexal regions, and pelvic cul-de-sac. It was necessary to proceed with endovaginal exam following the transabdominal exam to visualize the endometrium and ovaries. COMPARISON:  CT scan March 01, 2006 FINDINGS: Uterus Measurements: 9.5 x 5.4 x 7.1 cm = volume: 192 mL. The uterus is anteverted. There are at least 2 fibroids in the uterus. A fibroid in the right uterine body measures 2.2 x 3.1 x 2.5 cm. A fibroid at the fundus measures 1.7 x 1.5 x 1.5 cm. A third mass is centered in the endometrial canal measuring 3.0 x 4.0 x 2.5 cm. This endometrial canal based mass could represent a sub endometrial fibroid. A mass arising in the endometrium is not excluded on today's study. Endometrium Thickness: 26.4 mm. There is a mass centered in the endometrial canal measuring 3.0 x 4.0 x 2.5 cm. While this could represent a sub endometrial fibroid, it would be  difficult to exclude an endometrial based mass on this study. Right ovary Measurements: 3.3 x 2.0 x 3.5 cm = volume: 12.2 mL. Normal appearance/no adnexal mass. Left ovary Measurements: 3.3 x 1.9 x 2.4 cm = volume: 7.8 mL. Normal appearance/no adnexal mass. Other findings No abnormal free fluid. IMPRESSION: 1. There is a 3.0 x 4.0 x 2.5 cm mass centered in the endometrial canal. While this could represent a sub endometrial fibroid, an endometrial based mass is not excluded. Recommend gynecologic consultation with pelvic MR for further evaluation. 2. The endometrial stripe thickness measured 26.4 mm. Evaluation of the endometrium is limited due to the mass centered in the endometrial canal. Recommend attention on MRI. 3. There are at least 2 fibroids in the uterus as above. These results will be called to the ordering clinician or representative by the Radiologist Assistant, and communication documented in the PACS or [employer]. Electronically Signed   By: Dorise Bullion III M.D   On: 01/01/2021 10:52    Assessment:   ICD-10-CM   1. Menorrhagia with irregular cycle  N92.1     2. Intramural  and submucous leiomyoma of uterus  D25.1    D25.0     3. Endometrial mass  N94.89        Plan: Patient will undergo surgical management with the above surgery.   The risks of surgery were discussed in detail with the patient including but not limited to: bleeding which may require transfusion or reoperation; infection which may require antibiotics; injury to surrounding organs which may involve bowel, bladder, ureters ; need for additional procedures including laparoscopy or laparotomy; thromboembolic phenomenon, surgical site problems and other postoperative/anesthesia complications. Likelihood of success in alleviating the patient's condition was discussed. Routine postoperative instructions will be reviewed with the patient and her family in detail after surgery.  The patient concurred with the proposed  plan, giving informed verbal consent for the surgery.  Consents not available today. So, will sign them in pre-op.  She has no questions, as she has been through a similar procedure in the past. Preoperative prophylactic antibiotics, as indicated, and SCDs ordered on call to the OR.    Prentice Docker, MD 01/21/2021 9:11 AM

## 2021-01-25 ENCOUNTER — Encounter
Admission: RE | Admit: 2021-01-25 | Discharge: 2021-01-25 | Disposition: A | Discharge status: Home / Self Care | Payer: 59 | Source: Ambulatory Visit | Attending: Obstetrics and Gynecology | Admitting: Obstetrics and Gynecology

## 2021-01-25 ENCOUNTER — Other Ambulatory Visit: Payer: Self-pay

## 2021-01-25 DIAGNOSIS — Z01818 Encounter for other preprocedural examination: Secondary | ICD-10-CM | POA: Insufficient documentation

## 2021-01-25 LAB — POTASSIUM: Potassium: 3.3 mmol/L — ABNORMAL LOW (ref 3.5–5.1)

## 2021-01-26 ENCOUNTER — Encounter: Payer: Self-pay | Admitting: Obstetrics and Gynecology

## 2021-01-26 ENCOUNTER — Ambulatory Visit: Payer: 59 | Admitting: Urgent Care

## 2021-01-26 ENCOUNTER — Ambulatory Visit
Admission: RE | Admit: 2021-01-26 | Discharge: 2021-01-26 | Disposition: A | Discharge status: Home / Self Care | Payer: 59 | Attending: Obstetrics and Gynecology | Admitting: Obstetrics and Gynecology

## 2021-01-26 ENCOUNTER — Encounter
Admission: RE | Disposition: A | Discharge status: Home / Self Care | Payer: Self-pay | Source: Home / Self Care | Attending: Obstetrics and Gynecology

## 2021-01-26 DIAGNOSIS — Z800 Family history of malignant neoplasm of digestive organs: Secondary | ICD-10-CM | POA: Insufficient documentation

## 2021-01-26 DIAGNOSIS — N921 Excessive and frequent menstruation with irregular cycle: Secondary | ICD-10-CM | POA: Diagnosis present

## 2021-01-26 DIAGNOSIS — E282 Polycystic ovarian syndrome: Secondary | ICD-10-CM | POA: Diagnosis not present

## 2021-01-26 DIAGNOSIS — Z79899 Other long term (current) drug therapy: Secondary | ICD-10-CM | POA: Insufficient documentation

## 2021-01-26 DIAGNOSIS — D250 Submucous leiomyoma of uterus: Secondary | ICD-10-CM | POA: Diagnosis not present

## 2021-01-26 DIAGNOSIS — N9489 Other specified conditions associated with female genital organs and menstrual cycle: Secondary | ICD-10-CM | POA: Diagnosis present

## 2021-01-26 DIAGNOSIS — I10 Essential (primary) hypertension: Secondary | ICD-10-CM

## 2021-01-26 DIAGNOSIS — Z9884 Bariatric surgery status: Secondary | ICD-10-CM | POA: Insufficient documentation

## 2021-01-26 DIAGNOSIS — Z803 Family history of malignant neoplasm of breast: Secondary | ICD-10-CM | POA: Insufficient documentation

## 2021-01-26 DIAGNOSIS — E7800 Pure hypercholesterolemia, unspecified: Secondary | ICD-10-CM

## 2021-01-26 DIAGNOSIS — D251 Intramural leiomyoma of uterus: Secondary | ICD-10-CM | POA: Diagnosis not present

## 2021-01-26 HISTORY — PX: DILATATION & CURETTAGE/HYSTEROSCOPY WITH MYOSURE: SHX6511

## 2021-01-26 LAB — POCT PREGNANCY, URINE: Preg Test, Ur: NEGATIVE

## 2021-01-26 SURGERY — DILATATION & CURETTAGE/HYSTEROSCOPY WITH MYOSURE
Anesthesia: General

## 2021-01-26 MED ORDER — SILVER NITRATE-POT NITRATE 75-25 % EX MISC
CUTANEOUS | Status: DC | PRN
  Administered 2021-01-26: 6 via TOPICAL

## 2021-01-26 MED ORDER — CHLORHEXIDINE GLUCONATE 0.12 % MT SOLN
OROMUCOSAL | Status: AC
  Administered 2021-01-26: 15 mL via OROMUCOSAL
  Filled 2021-01-26: qty 15

## 2021-01-26 MED ORDER — ORAL CARE MOUTH RINSE: 15.0000 mL | Freq: Once | OROMUCOSAL | Status: AC

## 2021-01-26 MED ORDER — MEPERIDINE HCL 25 MG/ML IJ SOLN: 6.2500 mg | INTRAMUSCULAR | Status: DC | PRN

## 2021-01-26 MED ORDER — SODIUM CHLORIDE 0.9 % IV SOLN
INTRAVENOUS | Status: DC | PRN
  Administered 2021-01-26 (×5): 100 ug via INTRAVENOUS

## 2021-01-26 MED ORDER — MIDAZOLAM HCL 2 MG/2ML IJ SOLN
INTRAMUSCULAR | Status: AC
  Filled 2021-01-26: qty 2

## 2021-01-26 MED ORDER — DEXAMETHASONE SODIUM PHOSPHATE 10 MG/ML IJ SOLN
INTRAMUSCULAR | Status: AC
  Filled 2021-01-26: qty 1

## 2021-01-26 MED ORDER — 0.9 % SODIUM CHLORIDE (POUR BTL) OPTIME
TOPICAL | Status: DC | PRN
  Administered 2021-01-26: 9000 mL

## 2021-01-26 MED ORDER — FENTANYL CITRATE (PF) 100 MCG/2ML IJ SOLN: 25.0000 ug | INTRAMUSCULAR | Status: DC | PRN

## 2021-01-26 MED ORDER — PROPOFOL 500 MG/50ML IV EMUL
INTRAVENOUS | Status: AC
  Filled 2021-01-26: qty 50

## 2021-01-26 MED ORDER — SILVER NITRATE-POT NITRATE 75-25 % EX MISC
CUTANEOUS | Status: AC
  Filled 2021-01-26: qty 10

## 2021-01-26 MED ORDER — FAMOTIDINE 20 MG PO TABS: 20.0000 mg | ORAL_TABLET | Freq: Once | ORAL | Status: AC

## 2021-01-26 MED ORDER — PROPOFOL 10 MG/ML IV BOLUS
INTRAVENOUS | Status: AC
  Filled 2021-01-26: qty 20

## 2021-01-26 MED ORDER — PROPOFOL 10 MG/ML IV BOLUS
INTRAVENOUS | Status: DC | PRN
  Administered 2021-01-26: 200 mg via INTRAVENOUS

## 2021-01-26 MED ORDER — DEXAMETHASONE SODIUM PHOSPHATE 10 MG/ML IJ SOLN
INTRAMUSCULAR | Status: DC | PRN
  Administered 2021-01-26: 4 mg via INTRAVENOUS

## 2021-01-26 MED ORDER — LACTATED RINGERS IV SOLN: INTRAVENOUS | Status: DC

## 2021-01-26 MED ORDER — FENTANYL CITRATE (PF) 100 MCG/2ML IJ SOLN
INTRAMUSCULAR | Status: AC
  Filled 2021-01-26: qty 2

## 2021-01-26 MED ORDER — LIDOCAINE HCL (CARDIAC) PF 100 MG/5ML IV SOSY
PREFILLED_SYRINGE | INTRAVENOUS | Status: DC | PRN
  Administered 2021-01-26: 100 mg via INTRAVENOUS

## 2021-01-26 MED ORDER — ONDANSETRON HCL 4 MG/2ML IJ SOLN: 4.0000 mg | Freq: Once | INTRAMUSCULAR | Status: DC | PRN

## 2021-01-26 MED ORDER — FENTANYL CITRATE (PF) 100 MCG/2ML IJ SOLN
INTRAMUSCULAR | Status: DC | PRN
  Administered 2021-01-26: 50 ug via INTRAVENOUS
  Administered 2021-01-26 (×2): 25 ug via INTRAVENOUS

## 2021-01-26 MED ORDER — HYDROCODONE-ACETAMINOPHEN 5-325 MG PO TABS: 1.0000 | ORAL_TABLET | Freq: Four times a day (QID) | ORAL | 0 refills | Status: DC | PRN

## 2021-01-26 MED ORDER — FAMOTIDINE 20 MG PO TABS
ORAL_TABLET | ORAL | Status: AC
  Administered 2021-01-26: 20 mg via ORAL
  Filled 2021-01-26: qty 1

## 2021-01-26 MED ORDER — ONDANSETRON HCL 4 MG/2ML IJ SOLN
INTRAMUSCULAR | Status: AC
  Filled 2021-01-26: qty 2

## 2021-01-26 MED ORDER — MIDAZOLAM HCL 2 MG/2ML IJ SOLN
INTRAMUSCULAR | Status: DC | PRN
  Administered 2021-01-26: 2 mg via INTRAVENOUS

## 2021-01-26 MED ORDER — CHLORHEXIDINE GLUCONATE 0.12 % MT SOLN: 15.0000 mL | Freq: Once | OROMUCOSAL | Status: AC

## 2021-01-26 MED ORDER — IBUPROFEN 600 MG PO TABS: 600.0000 mg | ORAL_TABLET | Freq: Four times a day (QID) | ORAL | 0 refills | Status: DC

## 2021-01-26 MED ORDER — ONDANSETRON HCL 4 MG/2ML IJ SOLN
INTRAMUSCULAR | Status: DC | PRN
  Administered 2021-01-26: 4 mg via INTRAVENOUS

## 2021-01-26 SURGICAL SUPPLY — 28 items
BACTOSHIELD CHG 4% 4OZ (MISCELLANEOUS) ×1
BAG DRN RND TRDRP ANRFLXCHMBR (UROLOGICAL SUPPLIES) ×1
BAG URINE DRAIN 2000ML AR STRL (UROLOGICAL SUPPLIES) ×2 IMPLANT
CATH FOLEY 2WAY  5CC 16FR (CATHETERS) ×1
CATH FOLEY 2WAY 5CC 16FR (CATHETERS) ×1
CATH FOLEY 2WAY SIL 16X30 (CATHETERS) ×2 IMPLANT
CATH ROBINSON RED A/P 16FR (CATHETERS) ×2 IMPLANT
CATH URTH 16FR FL 2W BLN LF (CATHETERS) ×1 IMPLANT
DEVICE MYOSURE LITE (MISCELLANEOUS) IMPLANT
DEVICE MYOSURE REACH (MISCELLANEOUS) ×2 IMPLANT
ELECT REM PT RETURN 9FT ADLT (ELECTROSURGICAL) ×2
ELECTRODE REM PT RTRN 9FT ADLT (ELECTROSURGICAL) ×1 IMPLANT
GAUZE 4X4 16PLY ~~LOC~~+RFID DBL (SPONGE) ×4 IMPLANT
GLOVE SURG ENC MOIS LTX SZ7 (GLOVE) ×8 IMPLANT
GLOVE SURG UNDER POLY LF SZ7.5 (GLOVE) ×8 IMPLANT
GOWN STRL REUS W/ TWL LRG LVL3 (GOWN DISPOSABLE) ×2 IMPLANT
GOWN STRL REUS W/TWL LRG LVL3 (GOWN DISPOSABLE) ×4
IV NS IRRIG 3000ML ARTHROMATIC (IV SOLUTION) ×12 IMPLANT
KIT PROCEDURE FLUENT (KITS) ×2 IMPLANT
KIT TURNOVER CYSTO (KITS) ×2 IMPLANT
MANIFOLD NEPTUNE II (INSTRUMENTS) ×2 IMPLANT
PACK DNC HYST (MISCELLANEOUS) ×2 IMPLANT
PAD OB MATERNITY 4.3X12.25 (PERSONAL CARE ITEMS) ×2 IMPLANT
PAD PREP 24X41 OB/GYN DISP (PERSONAL CARE ITEMS) ×2 IMPLANT
SCRUB CHG 4% DYNA-HEX 4OZ (MISCELLANEOUS) ×1 IMPLANT
SEAL ROD LENS SCOPE MYOSURE (ABLATOR) ×2 IMPLANT
SURGILUBE 2OZ TUBE FLIPTOP (MISCELLANEOUS) ×2 IMPLANT
TUBING CONNECTING 10 (TUBING) ×2 IMPLANT

## 2021-01-26 NOTE — Discharge Instructions (Signed)
AMBULATORY SURGERY  ?DISCHARGE INSTRUCTIONS ? ? ?The drugs that you were given will stay in your system until tomorrow so for the next 24 hours you should not: ? ?Drive an automobile ?Make any legal decisions ?Drink any alcoholic beverage ? ? ?You may resume regular meals tomorrow.  Today it is better to start with liquids and gradually work up to solid foods. ? ?You may eat anything you prefer, but it is better to start with liquids, then soup and crackers, and gradually work up to solid foods. ? ? ?Please notify your doctor immediately if you have any unusual bleeding, trouble breathing, redness and pain at the surgery site, drainage, fever, or pain not relieved by medication. ? ? ? ?Additional Instructions: ? ? ? ?Please contact your physician with any problems or Same Day Surgery at [phone], Monday through Friday 6 am to 4 pm, or [place] Main number at [phone].  ?

## 2021-01-26 NOTE — Op Note (Signed)
Operative Note   Name: Rachel Gordon   Date of Service: 01/26/2021   DOB: 24-Jan-1966   MRN: [id number]    PRE-OP DIAGNOSIS:  1) Menorrhagia with irregular cycle [N92.1] 2) Intramural and submucous leiomyoma of uterus [D25.1, D25.0] 3) Endometrial mass [N94.89]   POST-OP DIAGNOSIS:  1) Menorrhagia with irregular cycle [N92.1] 2) Intramural and submucous leiomyoma of uterus [D25.1, D25.0] 3) Endometrial mass [N94.89]   SURGEON: Surgeon(s) and Role:    Will Bonnet, MD - Primary  PROCEDURE: Procedure(s): 1) Hysteroscopy 2) Dilation and curettage 3) Removal of endometrial mass  ANESTHESIA: General LMA  ESTIMATED BLOOD LOSS: 100 mL   DRAINS: none   TOTAL IV FLUIDS: 1,000 mL  SPECIMENS: Endometrial mass   VTE PROPHYLAXIS: SCDs to the bilateral lower extremities  ANTIBIOTICS: none  FLUID DEFICIT: 409 mL  COMPLICATIONS: none  DISPOSITION: PACU - hemodynamically stable.  CONDITION: stable  FINDINGS: Exam under anesthesia revealed medium sized, mobile anteverted uterus with no masses and bilateral adnexa without masses or fullness. Hysteroscopy revealed a uterine cavity with a large-based mass that covers view of most of the fundal area.  There are areas where the fundus can be visualized.  Bilateral tubal ostia and normal appearing endocervical canal.  At the end of the procedure, the fundus is visible along with the tubal ostia.   PROCEDURE IN DETAIL:  After informed consent was obtained, the patient was taken to the operating room where anesthesia was obtained without difficulty. The patient was positioned in the dorsal lithotomy position in candy cane stirrups.  The patient's bladder was catheterized with an in and out foley catheter.  The patient was examined under anesthesia, with the above noted findings.  The bi-valved speculum was placed inside the patient's vagina, and the the anterior lip of the cervix was grasped with the tenaculum.  The cervix was  progressively dilated to a 7 mm Hegar dilator.  The hysteroscope was introduced, with the above noted findings.  The MyoSure Reach device was introduced and nearly all the mass was resected.  At the end of the procedure there was some continued bleeding.  Due to a quickly-accumulating fluid deficit, tamponade could not be obtained using intrauterine fluid pressure.  Therefore the hysteroscope was removed and a foley catheter was introduced into the uterus. The balloon was filled with 30 mL sterile saline.  This was left in place for about 10 minutes. There was no bleeding coming through the catheter despite flushing the catheter. The balloon was gradually emptied of saline with hemostasis noted. Silver nitrate was applied to the cervix in order to gain hemostasis at the tenaculum entry sites.   The patient tolerated the procedure well.  Sponge, lap and needle counts were correct x2.  The patient was taken to recovery room in excellent condition.  Will Bonnet, MD, [employer] 01/26/2021 2:09 PM

## 2021-01-26 NOTE — Transfer of Care (Signed)
Immediate Anesthesia Transfer of Care Note  Patient: Rachel Gordon  Procedure(s) Performed: DILATATION & CURETTAGE/HYSTEROSCOPY WITH MYOSURE  Patient Location: PACU  Anesthesia Type:General  Level of Consciousness: awake, alert  and oriented  Airway & Oxygen Therapy: Patient Spontanous Breathing  Post-op Assessment: Report given to RN  Post vital signs: stable  Last Vitals:  Vitals Value Taken Time  BP 129/83 01/26/21 1417  Temp    Pulse 93 01/26/21 1418  Resp 14 01/26/21 1418  SpO2 100 % 01/26/21 1418  Vitals shown include unvalidated device data.  Last Pain:  Vitals:   01/26/21 1141  TempSrc: Temporal  PainSc: 0-No pain         Complications: No notable events documented.

## 2021-01-26 NOTE — Interval H&P Note (Signed)
History and Physical Interval Note:  01/26/2021 12:01 PM  Shatora L Ficek  has presented today for surgery, with the diagnosis of Menorrhagia with irregular cycle N92.1  Endometrial mass N94.89 Intramural and submucous leiomyoma of uterus D25.1, D25.0.  The various methods of treatment have been discussed with the patient and family. After consideration of risks, benefits and other options for treatment, the patient has consented to  Procedure(s): [place] (N/A), Submucosal myomectomy and resection uterine mass, as a surgical intervention.  The patient's history has been reviewed, patient examined, no change in status, stable for surgery.  I have reviewed the patient's chart and labs.  Questions were answered to the patient's satisfaction.     Prentice Docker, MD, [facility], [facility] 01/26/2021 12:01 PM

## 2021-01-26 NOTE — Anesthesia Preprocedure Evaluation (Signed)
Anesthesia Evaluation  Patient identified by MRN, date of birth, ID band Patient awake    Reviewed: Allergy & Precautions, NPO status , Patient's Chart, lab work & pertinent test results  Airway Mallampati: III  TM Distance: >3 FB Neck ROM: Full  Mouth opening: Limited Mouth Opening  Dental no notable dental hx. (+) Chipped, Dental Advisory Given,    Pulmonary sleep apnea ,    Pulmonary exam normal breath sounds clear to auscultation       Cardiovascular hypertension, Pt. on medications Normal cardiovascular exam Rhythm:Regular Rate:Normal  HLD  EKG 05/2019 Normal sinus rhythm Right bundle branch block Inferior infarct , age undetermined   Neuro/Psych  Headaches, negative psych ROS   GI/Hepatic Neg liver ROS, hiatal hernia, GERD  ,  Endo/Other  diabetesMorbid obesityPCOS  Renal/GU Renal disease (Stage II, Cr 0.91, K 4.9)  negative genitourinary   Musculoskeletal negative musculoskeletal ROS (+)   Abdominal   Peds  Hematology negative hematology ROS (+) anemia ,   Anesthesia Other Findings Abnormal uterine bleeding (AUB)  Anemia    Chronic kidney disease  DM (diabetes mellitus) ([facility]) h/o had bariatric surgery in 2021 and is a1c is now 5.4 as of 01-15-21  Fibroid    GERD (gastroesophageal reflux disease) Headache  h/o  History of hiatal hernia    Hyperlipidemia    Hyperlipidemia    Hypertension  h/o had bariatric sx in 2021 and has been off meds since Metrorrhagia    Obesity  bmi 54  PCOS (polycystic ovarian syndrome) S/P endometrial ablation   Sleep apnea  h/o has been off cpap since bariatric sx in 2021  Vaginal Pap smear, abnormal 2009 pos hvp     Reproductive/Obstetrics                            Anesthesia Physical  Anesthesia Plan  ASA: 3  Anesthesia Plan: General   Post-op Pain Management:    Induction: Intravenous  PONV Risk Score and Plan: 3 and Midazolam,  Dexamethasone and Ondansetron  Airway Management Planned: Oral ETT and LMA  Additional Equipment:   Intra-op Plan:   Post-operative Plan: Extubation in OR  Informed Consent: I have reviewed the patients History and Physical, chart, labs and discussed the procedure including the risks, benefits and alternatives for the proposed anesthesia with the patient or authorized representative who has indicated his/her understanding and acceptance.     Dental advisory given  Plan Discussed with: CRNA, Anesthesiologist and Surgeon  Anesthesia Plan Comments:        Anesthesia Quick Evaluation

## 2021-01-26 NOTE — Anesthesia Postprocedure Evaluation (Signed)
Anesthesia Post Note  Patient: Rachel Gordon  Procedure(s) Performed: DILATATION & CURETTAGE/HYSTEROSCOPY WITH [place]  Patient location during evaluation: PACU Anesthesia Type: General Level of consciousness: awake and alert, awake and oriented Pain management: pain level controlled Vital Signs Assessment: post-procedure vital signs reviewed and stable Respiratory status: spontaneous breathing, nonlabored ventilation and respiratory function stable Cardiovascular status: blood pressure returned to baseline and stable Postop Assessment: no apparent nausea or vomiting Anesthetic complications: no   No notable events documented.   Last Vitals:  Vitals:   01/26/21 1445 01/26/21 1458  BP: 134/85 137/76  Pulse: 66 66  Resp: 19 16  Temp:  (!) 36.3 C  SpO2: 100% 100%    Last Pain:  Vitals:   01/26/21 1458  TempSrc: Temporal  PainSc: 0-No pain                 Phill Mutter

## 2021-01-27 ENCOUNTER — Encounter: Payer: Self-pay | Admitting: Obstetrics and Gynecology

## 2021-01-28 LAB — SURGICAL PATHOLOGY

## 2021-02-18 ENCOUNTER — Ambulatory Visit: Payer: 59 | Admitting: Obstetrics and Gynecology

## 2021-03-11 ENCOUNTER — Ambulatory Visit: Payer: 59 | Admitting: Obstetrics and Gynecology

## 2021-03-11 ENCOUNTER — Other Ambulatory Visit: Payer: Self-pay

## 2021-03-11 ENCOUNTER — Encounter: Payer: Self-pay | Admitting: Obstetrics and Gynecology

## 2021-03-11 VITALS — BP 124/77 | Ht 64.0 in | Wt 210.0 lb

## 2021-03-11 DIAGNOSIS — N921 Excessive and frequent menstruation with irregular cycle: Secondary | ICD-10-CM

## 2021-03-11 DIAGNOSIS — D250 Submucous leiomyoma of uterus: Secondary | ICD-10-CM

## 2021-03-11 DIAGNOSIS — Z09 Encounter for follow-up examination after completed treatment for conditions other than malignant neoplasm: Secondary | ICD-10-CM

## 2021-03-11 DIAGNOSIS — N9489 Other specified conditions associated with female genital organs and menstrual cycle: Secondary | ICD-10-CM

## 2021-03-11 DIAGNOSIS — D251 Intramural leiomyoma of uterus: Secondary | ICD-10-CM

## 2021-03-11 NOTE — Progress Notes (Signed)
   Postoperative Follow-up Patient presents post op from hysteroscopy, dilation and curettage, submucosal myomectomy 6 weeks ago for menorrhagia with irregular cycle, submucous leiomyoma of uuterus.  Subjective: Patient reports some improvement in her preop symptoms. Eating a regular diet without difficulty. The patient is not having any pain.  Activity: normal activities of daily living.  She denies fever, chills, nausea, and vomiting.   PATHOLOGY REPORT: DIAGNOSIS:  A. ENDOMETRIAL MASS; CURETTINGS:  - MULTIPLE FRAGMENTS OF LEIOMYOMATA.  - FEW FRAGMENTS OF BENIGN PROLIFERATIVE ENDOMETRIUM.  - NEGATIVE FOR ATYPIA AND MALIGNANCY.   Objective: Vital Signs: BP 124/77   Ht '5\' 4"'$  (1.626 m)   Wt 210 lb (95.3 kg)   BMI 36.05 kg/m  Physical Exam Constitutional:      General: She is not in acute distress.    Appearance: Normal appearance.  HENT:     Head: Normocephalic and atraumatic.  Eyes:     General: No scleral icterus.    Conjunctiva/sclera: Conjunctivae normal.  Neurological:     General: No focal deficit present.     Mental Status: She is alert and oriented to person, place, and time.     Cranial Nerves: No cranial nerve deficit.  Psychiatric:        Mood and Affect: Mood normal.        Behavior: Behavior normal.        Judgment: Judgment normal.     Assessment: 55 y.o. s/p above surgery progressing well  Plan: Patient has done well after surgery with no apparent complications.  I have discussed the post-operative course to date, and the expected progress moving forward.  The patient understands what complications to be concerned about.  I will see the patient in routine follow up, or sooner if needed.    Activity plan: No restriction.  Return in about 6 months (around 09/11/2021) for Annual Gynecologic Examination.   Prentice Docker, MD  03/11/2021, 8:11 AM

## 2021-03-25 ENCOUNTER — Encounter: Payer: Self-pay | Admitting: Nurse Practitioner

## 2021-03-25 ENCOUNTER — Ambulatory Visit (INDEPENDENT_AMBULATORY_CARE_PROVIDER_SITE_OTHER): Payer: 59 | Admitting: Nurse Practitioner

## 2021-03-25 ENCOUNTER — Encounter: Payer: 59 | Admitting: Nurse Practitioner

## 2021-03-25 ENCOUNTER — Other Ambulatory Visit: Payer: Self-pay

## 2021-03-25 VITALS — BP 106/68 | HR 82 | Temp 98.2°F | Ht 64.0 in | Wt 212.8 lb

## 2021-03-25 DIAGNOSIS — F4321 Adjustment disorder with depressed mood: Secondary | ICD-10-CM

## 2021-03-25 DIAGNOSIS — E559 Vitamin D deficiency, unspecified: Secondary | ICD-10-CM | POA: Diagnosis not present

## 2021-03-25 DIAGNOSIS — R5383 Other fatigue: Secondary | ICD-10-CM | POA: Diagnosis not present

## 2021-03-25 DIAGNOSIS — Z7689 Persons encountering health services in other specified circumstances: Secondary | ICD-10-CM | POA: Diagnosis not present

## 2021-03-25 DIAGNOSIS — Z9884 Bariatric surgery status: Secondary | ICD-10-CM

## 2021-03-25 DIAGNOSIS — Z6836 Body mass index (BMI) 36.0-36.9, adult: Secondary | ICD-10-CM

## 2021-03-25 DIAGNOSIS — D509 Iron deficiency anemia, unspecified: Secondary | ICD-10-CM | POA: Insufficient documentation

## 2021-03-25 NOTE — Progress Notes (Signed)
New Patient Office Visit  Subjective:  Patient ID: Rachel Gordon, female    DOB: [birth year]  Age: 55 y.o. MRN: [id number]  CC:  Chief Complaint  Patient presents with   New Patient (Initial Visit)    HPI Rachel Gordon presents to establish new primary care provider.  She states she is on a lot of things going on left past year and a half.  She did have gastric sleeve surgery done on 10/29/2019.  She states during her recovery she did very well.  She lost 120 pounds.  In Feb 15, 2021, her mother passed away the patient she started to have vaginal bleeding.  She had appointment with GYN provider who found 3 fibroids in her uterus.  Later that month, patient had fibroid removal and D&C procedure.  Patient states that since then she has been fatigued and really lacks motivation to do anything.  Has gained about 10 pounds since her mother passed away and having to have surgery.  The patient states that when bringing this up with prior primary care provider, her hemoglobin A1c was checked and it was 5.4.  Based on labs done in 3, 2022, lipid panel showing LDL at 120, patient was started on Lipitor 10 mg tablets.  No additional labs were done.  States she had joint pain and intolerance to cold after starting Lipitor so she stopped after 2 to 3 days.  Lipid Panel     Component Value Date/Time   CHOL 195 10/09/2020 0920   TRIG 68 10/09/2020 0920   HDL 63 10/09/2020 0920   LDLCALC 120 (H) 10/09/2020 0920   LABVLDL 12 10/09/2020 0920   Denies feeling suicidal.  Does feel like she is grieving the loss of her mother.  She does not have a lot of family close by.  She states her brother lives in [place] and was not able to make it back after their mother passed away. Physically, the patient states she feels well.  She denies chest pain, chest pressure, or shortness of breath. She denies headaches or visual disturbances. She denies abdominal pain, nausea, vomiting, or changes in bowel or bladder  habits.    Past Medical History:  Diagnosis Date   Abnormal uterine bleeding (AUB)    Anemia    Chronic kidney disease    DM (diabetes mellitus) ([place])    h/o had bariatric surgery in 2021 and is a1c is now 5.4 as of 01-15-21   Fibroid    GERD (gastroesophageal reflux disease)    Headache    h/o   History of hiatal hernia    Hyperlipidemia    Hyperlipidemia    Hypertension    h/o had bariatric sx in 2021 and has been off meds since   Metrorrhagia    Obesity    bmi 54   PCOS (polycystic ovarian syndrome)    S/P endometrial ablation    Sleep apnea    h/o has been off cpap since bariatric sx in 2021   Vaginal Pap smear, abnormal 2009   pos hvp    Past Surgical History:  Procedure Laterality Date   COLONOSCOPY     DILATATION & CURETTAGE/HYSTEROSCOPY WITH MYOSURE N/A 11/28/2014   Procedure: DILATATION & CURETTAGE/HYSTEROSCOPY WITH MYOSURE;  Surgeon: Will Bonnet, MD;  Location: [facility];  Service: Gynecology;  Laterality: N/A;   DILATATION & CURETTAGE/HYSTEROSCOPY WITH MYOSURE N/A 01/26/2021   Procedure: DILATATION & CURETTAGE/HYSTEROSCOPY WITH MYOSURE;  Surgeon: Will Bonnet, MD;  Location: [facility];  Service: Gynecology;  Laterality: N/A;   DILATION AND CURETTAGE OF UTERUS  2002   LAPAROSCOPIC GASTRIC SLEEVE RESECTION N/A 10/29/2019   Procedure: LAPAROSCOPIC GASTRIC SLEEVE RESECTION, Upper Endo, ERAS Pathway;  Surgeon: Johnathan Hausen, MD;  Location: [facility];  Service: General;  Laterality: N/A;   NOVASURE ABLATION     TONSILLECTOMY     age 79    Family History  Problem Relation Age of Onset   Colon cancer Mother 65   Colon cancer Father 20   Breast cancer Maternal Grandmother 67    Social History   Socioeconomic History   Marital status: Single    Spouse name: Not on file   Number of children: Not on file   Years of education: Not on file   Highest education level: Not on file  Occupational History   Not on file  Tobacco Use   Smoking status: Never    Smokeless tobacco: Never  Vaping Use   Vaping Use: Never used  Substance and Sexual Activity   Alcohol use: Not Currently    Comment: socially   Drug use: No   Sexual activity: Yes    Partners: Male    Birth control/protection: None  Other Topics Concern   Not on file  Social History Narrative   Not on file   Social Determinants of Health   Financial Resource Strain: Not on file  Food Insecurity: Not on file  Transportation Needs: Not on file  Physical Activity: Not on file  Stress: Not on file  Social Connections: Not on file  Intimate Partner Violence: Not on file    ROS Review of Systems  Constitutional:  Positive for fatigue and unexpected weight change. Negative for activity change, appetite change, chills and fever.       Patient has had a weight gain of 9 pounds since last being seen by her previous primary care provider.  HENT:  Negative for congestion, postnasal drip, rhinorrhea, sinus pressure, sinus pain, sneezing and sore throat.   Eyes: Negative.   Respiratory:  Negative for cough and shortness of breath.   Cardiovascular:  Negative for chest pain and palpitations.  Gastrointestinal:  Negative for abdominal pain, constipation, diarrhea, nausea and vomiting.  Endocrine: Positive for cold intolerance. Negative for heat intolerance, polydipsia and polyuria.  Genitourinary:  Negative for dyspareunia, dysuria, flank pain, frequency and urgency.  Musculoskeletal:  Negative for arthralgias, back pain and myalgias.  Skin:  Negative for rash.  Allergic/Immunologic: Negative.  Negative for environmental allergies.  Neurological:  Negative for dizziness, weakness and headaches.  Hematological:  Negative for adenopathy.  Psychiatric/Behavioral:  Positive for decreased concentration. The patient is not nervous/anxious.    Objective:   Today's Vitals   03/25/21 0853  BP: 106/68  Pulse: 82  Temp: 98.2 F (36.8 C)  SpO2: 97%  Weight: 212 lb 12.8 oz (96.5 kg)   Height: '5\' 4"'$  (1.626 m)   Body mass index is 36.53 kg/m.   Physical Exam Vitals and nursing note reviewed.  Constitutional:      Appearance: Normal appearance. She is well-developed. She is obese.  HENT:     Head: Normocephalic and atraumatic.     Nose: Nose normal.  Eyes:     Extraocular Movements: Extraocular movements intact.     Conjunctiva/sclera: Conjunctivae normal.     Pupils: Pupils are equal, round, and reactive to light.  Cardiovascular:     Rate and Rhythm: Normal rate and regular rhythm.     Pulses: Normal pulses.  Heart sounds: Normal heart sounds.  Pulmonary:     Effort: Pulmonary effort is normal.     Breath sounds: Normal breath sounds.  Abdominal:     Palpations: Abdomen is soft.  Musculoskeletal:        General: Normal range of motion.     Cervical back: Normal range of motion and neck supple.  Lymphadenopathy:     Cervical: No cervical adenopathy.  Skin:    General: Skin is warm and dry.     Capillary Refill: Capillary refill takes less than 2 seconds.  Neurological:     General: No focal deficit present.     Mental Status: She is alert and oriented to person, place, and time.  Psychiatric:        Attention and Perception: Attention and perception normal.        Mood and Affect: Mood normal. Affect is tearful.        Speech: Speech normal.        Behavior: Behavior normal. Behavior is cooperative.        Thought Content: Thought content normal.        Cognition and Memory: Cognition and memory normal.        Judgment: Judgment normal.    Assessment & Plan:  1. Encounter to establish care Clinic today to establish new primary care provider.  2. Fatigue, unspecified type Will check blood count along with ferritin, B12, and thyroid panel for further evaluation. - CBC  3. Iron deficiency anemia, unspecified iron deficiency anemia type Will check CBC, ferritin, and B12 levels for further evaluation. - CBC - Ferritin - Vitamin B12  4.  Vitamin D deficiency Check vitamin D level along with thyroid panel and hemoglobin A1c. - Vitamin D (25 hydroxy) - HgB A1c - TSH + free T4  5. History of weight loss surgery Check BMP, CBC, folic acid, and vitamin D for further evaluation. - Folate - Vitamin D (25 hydroxy)  - Basic Metabolic Panel (BMET)  6. Grief Suspect that some of patient's symptoms can be related related to delayed grief due to the loss of her mother in May, 2022.  Was unable to process due to having her own health concerns and need for surgery in July, 2022.  If labs are normal, discussed with patient starting low-dose of Wellbutrin to help with mild depression and help regulate feelings of grief.  This medication can also help her with weight loss and help increase motivation and focus.  The patient is agreeable to this plan. Problem List Items Addressed This Visit       Other   History of weight loss surgery   Relevant Orders   Folate   Vitamin D (25 hydroxy)   Encounter to establish care - Primary   Fatigue   Relevant Orders   CBC   Iron deficiency anemia   Relevant Orders   Basic Metabolic Panel (BMET)   Ferritin   Vitamin B12   Vitamin D deficiency   Relevant Orders   Vitamin D (25 hydroxy)   HgB A1c   TSH + free T4    Outpatient Encounter Medications as of 03/25/2021  Medication Sig   CALCIUM PO Take 1 tablet by mouth 3 (three) times daily.   hydrochlorothiazide (HYDRODIURIL) 12.5 MG tablet Take 1 tablet (12.5 mg total) by mouth daily.   medroxyPROGESTERone (PROVERA) 10 MG tablet Take 10 mg by mouth daily.   Multiple Vitamins-Minerals (MULTIVITAMIN WITH MINERALS) tablet Take 1 tablet by mouth daily.  Bariatric   [DISCONTINUED] atorvastatin (LIPITOR) 10 MG tablet Take 1 tablet (10 mg total) by mouth every morning.   [DISCONTINUED] HYDROcodone-acetaminophen (NORCO) 5-325 MG tablet Take 1 tablet by mouth every 6 (six) hours as needed for moderate pain. (Patient not taking: Reported on 03/11/2021)    [DISCONTINUED] ibuprofen (ADVIL) 600 MG tablet Take 1 tablet (600 mg total) by mouth every 6 (six) hours.   No facility-administered encounter medications on file as of 03/25/2021.   This note was dictated using Systems analyst. Rapid proofreading was performed to expedite the delivery of the information. Despite proofreading, phonetic errors will occur which are common with this voice recognition software. Please take this into consideration. If there are any concerns, please contact our office.    Follow-up: Return in about 3 months (around 06/24/2021) for mood, anemia .   Ronnell Freshwater, NP

## 2021-03-25 NOTE — Patient Instructions (Signed)

## 2021-03-26 LAB — BASIC METABOLIC PANEL
BUN/Creatinine Ratio: 37 — ABNORMAL HIGH (ref 9–23)
BUN: 26 mg/dL — ABNORMAL HIGH (ref 6–24)
CO2: 22 mmol/L (ref 20–29)
Calcium: 9.5 mg/dL (ref 8.7–10.2)
Chloride: 100 mmol/L (ref 96–106)
Creatinine, Ser: 0.7 mg/dL (ref 0.57–1.00)
Glucose: 86 mg/dL (ref 65–99)
Potassium: 4.9 mmol/L (ref 3.5–5.2)
Sodium: 138 mmol/L (ref 134–144)
eGFR: 103 mL/min/{1.73_m2} (ref >59)

## 2021-03-26 LAB — CBC
Hematocrit: 36.1 % (ref 34.0–46.6)
Hemoglobin: 11.4 g/dL (ref 11.1–15.9)
MCH: 23.1 pg — ABNORMAL LOW (ref 26.6–33.0)
MCHC: 31.6 g/dL (ref 31.5–35.7)
MCV: 73 fL — ABNORMAL LOW (ref 79–97)
Platelets: 336 10*3/uL (ref 150–450)
RBC: 4.93 x10E6/uL (ref 3.77–5.28)
RDW: 15.8 % — ABNORMAL HIGH (ref 11.7–15.4)
WBC: 7.3 10*3/uL (ref 3.4–10.8)

## 2021-03-26 LAB — FERRITIN: Ferritin: 7 ng/mL — ABNORMAL LOW (ref 15–150)

## 2021-03-26 LAB — HEMOGLOBIN A1C
Est. average glucose Bld gHb Est-mCnc: 117 mg/dL
Hgb A1c MFr Bld: 5.7 % — ABNORMAL HIGH (ref 4.8–5.6)

## 2021-03-26 LAB — TSH+FREE T4
Free T4: 1.11 ng/dL (ref 0.82–1.77)
TSH: 2.77 u[IU]/mL (ref 0.450–4.500)

## 2021-03-26 LAB — VITAMIN B12: Vitamin B-12: 858 pg/mL (ref 232–1245)

## 2021-03-26 LAB — VITAMIN D 25 HYDROXY (VIT D DEFICIENCY, FRACTURES): Vit D, 25-Hydroxy: 40.2 ng/mL (ref 30.0–100.0)

## 2021-03-26 LAB — FOLATE: Folate: >20 ng/mL (ref >3.0)

## 2021-03-29 ENCOUNTER — Encounter: Payer: Self-pay | Admitting: Nurse Practitioner

## 2021-03-29 ENCOUNTER — Other Ambulatory Visit: Payer: Self-pay | Admitting: Nurse Practitioner

## 2021-03-29 DIAGNOSIS — I10 Essential (primary) hypertension: Secondary | ICD-10-CM

## 2021-03-29 MED ORDER — HYDROCHLOROTHIAZIDE 12.5 MG PO TABS: 12.5000 mg | ORAL_TABLET | Freq: Every day | ORAL | 2 refills | Status: DC

## 2021-03-29 NOTE — Progress Notes (Signed)
Sent new prescription for HCZT 12.'5mg'$  tablets daily to [facility]

## 2021-04-06 ENCOUNTER — Other Ambulatory Visit: Payer: Self-pay | Admitting: Nurse Practitioner

## 2021-04-06 DIAGNOSIS — Z6836 Body mass index (BMI) 36.0-36.9, adult: Secondary | ICD-10-CM

## 2021-04-06 DIAGNOSIS — F411 Generalized anxiety disorder: Secondary | ICD-10-CM

## 2021-04-06 MED ORDER — BUPROPION HCL ER (SR) 100 MG PO TB12: 100.0000 mg | ORAL_TABLET | Freq: Every day | ORAL | 3 refills | Status: DC

## 2021-04-06 NOTE — Progress Notes (Signed)
Sent MyChart message to patient regarding lab result. Will recommend ferritin supplement and reductio of carbs and sugar in the diet and increased exercise

## 2021-04-06 NOTE — Progress Notes (Signed)
Added prescription for wellbutrin SA 100mg  daiy.

## 2021-04-28 ENCOUNTER — Other Ambulatory Visit: Payer: Self-pay | Admitting: Nurse Practitioner

## 2021-04-28 DIAGNOSIS — Z6836 Body mass index (BMI) 36.0-36.9, adult: Secondary | ICD-10-CM

## 2021-04-28 DIAGNOSIS — F411 Generalized anxiety disorder: Secondary | ICD-10-CM

## 2021-06-24 ENCOUNTER — Ambulatory Visit: Payer: 59 | Admitting: Nurse Practitioner

## 2021-07-05 ENCOUNTER — Ambulatory Visit: Payer: 59 | Admitting: Nurse Practitioner

## 2021-07-05 ENCOUNTER — Other Ambulatory Visit: Payer: Self-pay | Admitting: Nurse Practitioner

## 2021-07-05 DIAGNOSIS — Z6836 Body mass index (BMI) 36.0-36.9, adult: Secondary | ICD-10-CM

## 2021-07-05 DIAGNOSIS — F411 Generalized anxiety disorder: Secondary | ICD-10-CM

## 2021-07-06 MED ORDER — BUPROPION HCL ER (SR) 100 MG PO TB12: 100.0000 mg | ORAL_TABLET | Freq: Every day | ORAL | 1 refills | Status: DC

## 2021-07-07 ENCOUNTER — Encounter: Payer: Self-pay | Admitting: Nurse Practitioner

## 2021-07-07 ENCOUNTER — Other Ambulatory Visit: Payer: Self-pay | Admitting: Nurse Practitioner

## 2021-07-07 DIAGNOSIS — Z6836 Body mass index (BMI) 36.0-36.9, adult: Secondary | ICD-10-CM

## 2021-07-07 DIAGNOSIS — F411 Generalized anxiety disorder: Secondary | ICD-10-CM

## 2021-07-07 MED ORDER — BUPROPION HCL ER (SR) 100 MG PO TB12: 100.0000 mg | ORAL_TABLET | Freq: Every day | ORAL | 1 refills | Status: DC

## 2021-07-07 NOTE — Telephone Encounter (Signed)
Called pt she is aware that her pharmacy is out of network for her Rx  she will contact office with another Pharmacy for Rx

## 2021-07-13 ENCOUNTER — Other Ambulatory Visit: Payer: Self-pay

## 2021-07-13 ENCOUNTER — Encounter: Payer: Self-pay | Admitting: Nurse Practitioner

## 2021-07-13 ENCOUNTER — Ambulatory Visit (INDEPENDENT_AMBULATORY_CARE_PROVIDER_SITE_OTHER): Payer: 59 | Admitting: Nurse Practitioner

## 2021-07-13 VITALS — BP 100/65 | HR 62 | Temp 98.4°F | Ht 64.0 in | Wt 219.6 lb

## 2021-07-13 DIAGNOSIS — D509 Iron deficiency anemia, unspecified: Secondary | ICD-10-CM | POA: Diagnosis not present

## 2021-07-13 DIAGNOSIS — E119 Type 2 diabetes mellitus without complications: Secondary | ICD-10-CM

## 2021-07-13 DIAGNOSIS — Z23 Encounter for immunization: Secondary | ICD-10-CM | POA: Diagnosis not present

## 2021-07-13 DIAGNOSIS — F411 Generalized anxiety disorder: Secondary | ICD-10-CM | POA: Diagnosis not present

## 2021-07-13 DIAGNOSIS — Z6837 Body mass index (BMI) 37.0-37.9, adult: Secondary | ICD-10-CM

## 2021-07-13 LAB — POCT UA - MICROALBUMIN

## 2021-07-13 NOTE — Progress Notes (Signed)
Established Patient Office Visit  Subjective:  Patient ID: Rachel Gordon, female    DOB: October 12, 1965  Age: 55 y.o. MRN: [id number]  CC:  Chief Complaint  Patient presents with   Hyperglycemia   Anxiety    HPI Rhonda Linan Blatter presents for follow-up visit.  She is concerned about 7 pound weight gain since her most recent visit.  She is also due for check of her hemoglobin A1c as she is prediabetic.  She states that in general she is doing well.  She has no physical concerns or complaints at this time.  She would like to get flu shot during today's visit.  Past Medical History:  Diagnosis Date   Abnormal uterine bleeding (AUB)    Anemia    Chronic kidney disease    DM (diabetes mellitus) ([place])    h/o had bariatric surgery in 2021 and is a1c is now 5.4 as of 01-15-21   Fibroid    GERD (gastroesophageal reflux disease)    Headache    h/o   History of hiatal hernia    Hyperlipidemia    Hyperlipidemia    Hypertension    h/o had bariatric sx in 2021 and has been off meds since   Metrorrhagia    Obesity    bmi 54   PCOS (polycystic ovarian syndrome)    S/P endometrial ablation    Sleep apnea    h/o has been off cpap since bariatric sx in 2021   Vaginal Pap smear, abnormal 2009   pos hvp    Past Surgical History:  Procedure Laterality Date   COLONOSCOPY     DILATATION & CURETTAGE/HYSTEROSCOPY WITH MYOSURE N/A 11/28/2014   Procedure: DILATATION & CURETTAGE/HYSTEROSCOPY WITH MYOSURE;  Surgeon: Will Bonnet, MD;  Location: [facility];  Service: Gynecology;  Laterality: N/A;   DILATATION & CURETTAGE/HYSTEROSCOPY WITH MYOSURE N/A 01/26/2021   Procedure: DILATATION & CURETTAGE/HYSTEROSCOPY WITH MYOSURE;  Surgeon: Will Bonnet, MD;  Location: [facility];  Service: Gynecology;  Laterality: N/A;   DILATION AND CURETTAGE OF UTERUS  2002   LAPAROSCOPIC GASTRIC SLEEVE RESECTION N/A 10/29/2019   Procedure: LAPAROSCOPIC GASTRIC SLEEVE RESECTION, Upper Endo, ERAS Pathway;   Surgeon: Johnathan Hausen, MD;  Location: [facility];  Service: General;  Laterality: N/A;   NOVASURE ABLATION     TONSILLECTOMY     age 74    Family History  Problem Relation Age of Onset   Colon cancer Mother 53   Colon cancer Father 64   Breast cancer Maternal Grandmother 6    Social History   Socioeconomic History   Marital status: Single    Spouse name: Not on file   Number of children: Not on file   Years of education: Not on file   Highest education level: Not on file  Occupational History   Not on file  Tobacco Use   Smoking status: Never   Smokeless tobacco: Never  Vaping Use   Vaping Use: Never used  Substance and Sexual Activity   Alcohol use: Not Currently    Comment: socially   Drug use: No   Sexual activity: Yes    Partners: Male    Birth control/protection: None  Other Topics Concern   Not on file  Social History Narrative   Not on file   Social Determinants of Health   Financial Resource Strain: Not on file  Food Insecurity: Not on file  Transportation Needs: Not on file  Physical Activity: Not on file  Stress: Not  on file  Social Connections: Not on file  Intimate Partner Violence: Not on file    Outpatient Medications Prior to Visit  Medication Sig Dispense Refill   buPROPion ER (WELLBUTRIN SR) 100 MG 12 hr tablet Take 1 tablet (100 mg total) by mouth daily. 90 tablet 1   CALCIUM PO Take 1 tablet by mouth 3 (three) times daily.     hydrochlorothiazide (HYDRODIURIL) 12.5 MG tablet Take 1 tablet (12.5 mg total) by mouth daily. 90 tablet 2   medroxyPROGESTERone (PROVERA) 10 MG tablet Take 10 mg by mouth daily.     Multiple Vitamins-Minerals (MULTIVITAMIN WITH MINERALS) tablet Take 1 tablet by mouth daily. Bariatric     No facility-administered medications prior to visit.    No Known Allergies  ROS Review of Systems  Constitutional:  Negative for activity change, appetite change, chills, fatigue and fever.       7 pound weight gain since  most recent visit.  HENT:  Negative for congestion, postnasal drip, rhinorrhea, sinus pressure, sinus pain, sneezing and sore throat.   Eyes: Negative.   Respiratory:  Negative for cough, chest tightness, shortness of breath and wheezing.   Cardiovascular:  Negative for chest pain and palpitations.  Gastrointestinal:  Negative for abdominal pain, constipation, diarrhea, nausea and vomiting.  Endocrine: Negative for cold intolerance, heat intolerance, polydipsia and polyuria.       Blood sugars doing well.  Her hemoglobin A1c is 5.7 today, stable since last visit.  Genitourinary:  Negative for dyspareunia, dysuria, flank pain, frequency and urgency.  Musculoskeletal:  Negative for arthralgias, back pain and myalgias.  Skin:  Negative for rash.  Allergic/Immunologic: Negative for environmental allergies.  Neurological:  Negative for dizziness, weakness and headaches.  Hematological:  Negative for adenopathy.  Psychiatric/Behavioral:  The patient is nervous/anxious.      Objective:    Physical Exam Vitals and nursing note reviewed.  Constitutional:      Appearance: Normal appearance. She is well-developed. She is obese.  HENT:     Head: Normocephalic and atraumatic.     Nose: Nose normal.     Mouth/Throat:     Mouth: Mucous membranes are moist.     Pharynx: Oropharynx is clear.  Eyes:     Extraocular Movements: Extraocular movements intact.     Conjunctiva/sclera: Conjunctivae normal.     Pupils: Pupils are equal, round, and reactive to light.  Cardiovascular:     Rate and Rhythm: Normal rate and regular rhythm.     Pulses: Normal pulses.     Heart sounds: Normal heart sounds.  Pulmonary:     Effort: Pulmonary effort is normal.     Breath sounds: Normal breath sounds.  Abdominal:     Palpations: Abdomen is soft.  Musculoskeletal:        General: Normal range of motion.     Cervical back: Normal range of motion and neck supple.  Lymphadenopathy:     Cervical: No cervical  adenopathy.  Skin:    General: Skin is warm and dry.     Capillary Refill: Capillary refill takes less than 2 seconds.  Neurological:     General: No focal deficit present.     Mental Status: She is alert and oriented to person, place, and time.  Psychiatric:        Mood and Affect: Mood normal.        Behavior: Behavior normal.        Thought Content: Thought content normal.  Judgment: Judgment normal.    Today's Vitals   07/13/21 1046  BP: 100/65  Pulse: 62  Temp: 98.4 F (36.9 C)  SpO2: 100%  Weight: 219 lb 9.6 oz (99.6 kg)  Height: '5\' 4"'  (1.626 m)   Body mass index is 37.69 kg/m.   Wt Readings from Last 3 Encounters:  07/13/21 219 lb 9.6 oz (99.6 kg)  03/25/21 212 lb 12.8 oz (96.5 kg)  03/11/21 210 lb (95.3 kg)     Health Maintenance Due  Topic Date Due   HIV Screening  Never done   Hepatitis C Screening  Never done   TETANUS/TDAP  Never done   Zoster Vaccines- Shingrix (1 of 2) Never done   COVID-19 Vaccine (4 - Booster for Moderna series) 07/29/2020   FOOT EXAM  10/16/2020   OPHTHALMOLOGY EXAM  06/04/2021    There are no preventive care reminders to display for this patient.  Lab Results  Component Value Date   TSH 2.770 03/25/2021   Lab Results  Component Value Date   WBC 7.0 07/13/2021   HGB 12.5 07/13/2021   HCT 39.9 07/13/2021   MCV 74 (L) 07/13/2021   PLT 340 07/13/2021   Lab Results  Component Value Date   NA 138 03/25/2021   K 4.9 03/25/2021   CO2 22 03/25/2021   GLUCOSE 86 03/25/2021   BUN 26 (H) 03/25/2021   CREATININE 0.70 03/25/2021   BILITOT 0.4 10/09/2020   ALKPHOS 45 10/09/2020   AST 20 10/09/2020   ALT 16 10/09/2020   PROT 7.1 10/09/2020   ALBUMIN 4.4 10/09/2020   CALCIUM 9.5 03/25/2021   ANIONGAP 13 10/29/2019   EGFR 103 03/25/2021   Lab Results  Component Value Date   CHOL 195 10/09/2020   Lab Results  Component Value Date   HDL 63 10/09/2020   Lab Results  Component Value Date   LDLCALC 120 (H)  10/09/2020   Lab Results  Component Value Date   TRIG 68 10/09/2020   No results found for: [facility] Lab Results  Component Value Date   HGBA1C 5.7 (H) 07/13/2021      Assessment & Plan:  1. Type 2 diabetes mellitus without complication, without long-term current use of insulin (HCC) We will draw hemoglobin A1c today.  Mild proteinuria present. - POCT UA - Microalbumin  2. Iron deficiency anemia, unspecified iron deficiency anemia type Check CBC and treat deficiency as indicated. - CBC  3. Generalized anxiety disorder Stable.  Continue Wellbutrin as prescribed.  4. Need for influenza vaccination Flu vaccine given during today's visit. - Flu Vaccine QUAD 6+ mos PF IM (Fluarix Quad PF)  5. Body mass index (BMI) of 37.0-37.9 in adult Discussed lowering calorie intake to 1500 calories per day and incorporating exercise into daily routine to help lose weight.    Problem List Items Addressed This Visit       Endocrine   Diabetes mellitus with stage 2 chronic kidney disease (HCC) - Primary     Other   Iron deficiency anemia   Relevant Orders   Hemoglobin A1c (Completed)   CBC (Completed)   Other Visit Diagnoses     Generalized anxiety disorder       Need for influenza vaccination       Relevant Orders   Flu Vaccine QUAD 6+ mos PF IM (Fluarix Quad PF) (Completed)   Body mass index (BMI) of 37.0-37.9 in adult          Follow-up: Return in about 4  months (around 11/11/2021) for check HgbA1c.    Ronnell Freshwater, NP  This note was dictated using Systems analyst. Rapid proofreading was performed to expedite the delivery of the information. Despite proofreading, phonetic errors will occur which are common with this voice recognition software. Please take this into consideration. If there are any concerns, please contact our office.

## 2021-07-14 LAB — CBC
Hematocrit: 39.9 % (ref 34.0–46.6)
Hemoglobin: 12.5 g/dL (ref 11.1–15.9)
MCH: 23.1 pg — ABNORMAL LOW (ref 26.6–33.0)
MCHC: 31.3 g/dL — ABNORMAL LOW (ref 31.5–35.7)
MCV: 74 fL — ABNORMAL LOW (ref 79–97)
Platelets: 340 10*3/uL (ref 150–450)
RBC: 5.4 x10E6/uL — ABNORMAL HIGH (ref 3.77–5.28)
RDW: 17.5 % — ABNORMAL HIGH (ref 11.7–15.4)
WBC: 7 10*3/uL (ref 3.4–10.8)

## 2021-07-14 LAB — HEMOGLOBIN A1C
Est. average glucose Bld gHb Est-mCnc: 117 mg/dL
Hgb A1c MFr Bld: 5.7 % — ABNORMAL HIGH (ref 4.8–5.6)

## 2021-07-15 ENCOUNTER — Encounter: Payer: Self-pay | Admitting: Nurse Practitioner

## 2021-07-15 ENCOUNTER — Other Ambulatory Visit: Payer: Self-pay | Admitting: Nurse Practitioner

## 2021-07-15 DIAGNOSIS — R7301 Impaired fasting glucose: Secondary | ICD-10-CM

## 2021-07-15 MED ORDER — RYBELSUS 3 MG PO TABS: 3.0000 mg | ORAL_TABLET | Freq: Every day | ORAL | 3 refills | Status: DC

## 2021-07-15 NOTE — Progress Notes (Signed)
Addbed back rybelsus 3mg  daily. Will recheck HgbA1c and CBC at next visit.

## 2021-07-19 NOTE — Patient Instructions (Signed)
Fat and Cholesterol Restricted Eating Plan Getting too much fat and cholesterol in your diet may cause health problems. Choosing the right foods helps keep your fat and cholesterol at normal levels. This can keep you from getting certain diseases. Your doctor may recommend an eating plan that includes: Total fat: ______% or less of total calories a day. This is ______g of fat a day. Saturated fat: ______% or less of total calories a day. This is ______g of saturated fat a day. Cholesterol: less than _________mg a day. Fiber: ______g a day. What are tips for following this plan? General tips Work with your doctor to lose weight if you need to. Avoid: Foods with added sugar. Fried foods. Foods with trans fat or partially hydrogenated oils. This includes some margarines and baked goods. If you drink alcohol: Limit how much you have to: 0-1 drink a day for women who are not pregnant. 0-2 drinks a day for men. Know how much alcohol is in a drink. In the [place] one drink equals one 12 oz bottle of beer (355 mL), one 5 oz glass of wine (148 mL), or one 1 oz glass of hard liquor (44 mL). Reading food labels Check food labels for: Trans fats. Partially hydrogenated oils. Saturated fat (g) in each serving. Cholesterol (mg) in each serving. Fiber (g) in each serving. Choose foods with healthy fats, such as: Monounsaturated fats and polyunsaturated fats. These include olive and canola oil, flaxseeds, walnuts, almonds, and seeds. Omega-3 fats. These are found in certain fish, flaxseed oil, and ground flaxseeds. Choose grain products that have whole grains. Look for the word "whole" as the first word in the ingredient list. Cooking Cook foods using low-fat methods. These include baking, boiling, grilling, and broiling. Eat more home-cooked foods. Eat at restaurants and buffets less often. Eat less fast food. Avoid cooking using saturated fats, such as butter, cream, palm oil, palm kernel oil, and  coconut oil. Meal planning  At meals, divide your plate into four equal parts: Fill one-half of your plate with vegetables, green salads, and fruit. Fill one-fourth of your plate with whole grains. Fill one-fourth of your plate with low-fat (lean) protein foods. Eat fish that is high in omega-3 fats at least two times a week. This includes mackerel, tuna, sardines, and salmon. Eat foods that are high in fiber, such as whole grains, beans, apples, pears, berries, broccoli, carrots, peas, and barley. What foods should I eat? Fruits All fresh, canned (in natural juice), or frozen fruits. Vegetables Fresh or frozen vegetables (raw, steamed, roasted, or grilled). Green salads. Grains Whole grains, such as whole wheat or whole grain breads, crackers, cereals, and pasta. Unsweetened oatmeal, bulgur, barley, quinoa, or brown rice. Corn or whole wheat flour tortillas. Meats and other protein foods Ground beef (85% or leaner), grass-fed beef, or beef trimmed of fat. Skinless chicken or turkey. Ground chicken or turkey. Pork trimmed of fat. All fish and seafood. Egg whites. Dried beans, peas, or lentils. Unsalted nuts or seeds. Unsalted canned beans. Nut butters without added sugar or oil. Dairy Low-fat or nonfat dairy products, such as skim or 1% milk, 2% or reduced-fat cheeses, low-fat and fat-free ricotta or cottage cheese, or plain low-fat and nonfat yogurt. Fats and oils Tub margarine without trans fats. Light or reduced-fat mayonnaise and salad dressings. Avocado. Olive, canola, sesame, or safflower oils. The items listed above may not be a complete list of foods and beverages you can eat. Contact a dietitian for more information. What foods   should I avoid? Fruits Canned fruit in heavy syrup. Fruit in cream or butter sauce. Fried fruit. Vegetables Vegetables cooked in cheese, cream, or butter sauce. Fried vegetables. Grains White bread. White pasta. White rice. Cornbread. Bagels, pastries,  and croissants. Crackers and snack foods that contain trans fat and hydrogenated oils. Meats and other protein foods Fatty cuts of meat. Ribs, chicken wings, bacon, sausage, bologna, salami, chitterlings, fatback, hot dogs, bratwurst, and packaged lunch meats. Liver and organ meats. Whole eggs and egg yolks. Chicken and turkey with skin. Fried meat. Dairy Whole or 2% milk, cream, half-and-half, and cream cheese. Whole milk cheeses. Whole-fat or sweetened yogurt. Full-fat cheeses. Nondairy creamers and whipped toppings. Processed cheese, cheese spreads, and cheese curds. Fats and oils Butter, stick margarine, lard, shortening, ghee, or bacon fat. Coconut, palm kernel, and palm oils. Beverages Alcohol. Sugar-sweetened drinks such as sodas, lemonade, and fruit drinks. Sweets and desserts Corn syrup, sugars, honey, and molasses. Candy. Jam and jelly. Syrup. Sweetened cereals. Cookies, pies, cakes, donuts, muffins, and ice cream. The items listed above may not be a complete list of foods and beverages you should avoid. Contact a dietitian for more information. Summary Choosing the right foods helps keep your fat and cholesterol at normal levels. This can keep you from getting certain diseases. At meals, fill one-half of your plate with vegetables, green salads, and fruits. Eat high fiber foods, like whole grains, beans, apples, pears, berries, carrots, peas, and barley. Limit added sugar, saturated fats, alcohol, and fried foods. This information is not intended to replace advice given to you by your health care provider. Make sure you discuss any questions you have with your health care provider. Document Revised: 11/13/2020 Document Reviewed: 11/13/2020 Elsevier Patient Education  2022 Elsevier Inc.  

## 2021-11-08 ENCOUNTER — Ambulatory Visit: Payer: 59 | Admitting: Nurse Practitioner

## 2021-11-15 ENCOUNTER — Encounter: Payer: Self-pay | Admitting: Nurse Practitioner

## 2021-11-15 ENCOUNTER — Ambulatory Visit (INDEPENDENT_AMBULATORY_CARE_PROVIDER_SITE_OTHER): Payer: 59 | Admitting: Nurse Practitioner

## 2021-11-15 VITALS — BP 106/65 | HR 92 | Temp 97.7°F | Ht 64.0 in | Wt 221.0 lb

## 2021-11-15 DIAGNOSIS — R7301 Impaired fasting glucose: Secondary | ICD-10-CM

## 2021-11-15 DIAGNOSIS — Z6837 Body mass index (BMI) 37.0-37.9, adult: Secondary | ICD-10-CM

## 2021-11-15 DIAGNOSIS — Z6836 Body mass index (BMI) 36.0-36.9, adult: Secondary | ICD-10-CM | POA: Diagnosis not present

## 2021-11-15 DIAGNOSIS — F411 Generalized anxiety disorder: Secondary | ICD-10-CM

## 2021-11-15 DIAGNOSIS — E119 Type 2 diabetes mellitus without complications: Secondary | ICD-10-CM | POA: Diagnosis not present

## 2021-11-15 LAB — POCT GLYCOSYLATED HEMOGLOBIN (HGB A1C): Hemoglobin A1C: 5.1 % (ref 4.0–5.6)

## 2021-11-15 MED ORDER — RYBELSUS 7 MG PO TABS: 7.0000 mg | ORAL_TABLET | Freq: Every day | ORAL | 2 refills | Status: DC

## 2021-11-15 MED ORDER — BUPROPION HCL ER (SR) 150 MG PO TB12: 150.0000 mg | ORAL_TABLET | Freq: Every day | ORAL | 3 refills | Status: DC

## 2021-11-15 NOTE — Progress Notes (Signed)
Established patient visit ? ? ?Patient: Rachel Gordon   DOB: 29-Apr-1966   56 y.o. Female  MRN: [id number] ?Visit Date: 11/15/2021 ? ? ?Chief Complaint  ?Patient presents with  ? Diabetes  ? ?Subjective  ?  ?HPI  ?Routine follow up visit.  ?-check HgbA1c. Has been considered prediabetic in the past. Currently taking Rybelsus '3mg'$  daily. Has been up to '14mg'$  daily and did well. Better on this than on metformin. Sugar well controlled with hgbA1c 5.1 today.  ?-taking Wellbutrin to help with anxiety/depression. Feels like wellbutrin may not be doing so much good any longer.  ?-no new concerns or complaints today. denies chest pain, chest pressure, or shortness of breath. She denies headaches or visual disturbances. She denies abdominal pain, nausea, vomiting, or changes in bowel or bladder habits.   ? ?Medications: ?Outpatient Medications Prior to Visit  ?Medication Sig Note  ? CALCIUM PO Take 1 tablet by mouth 3 (three) times daily.   ? hydrochlorothiazide (HYDRODIURIL) 12.5 MG tablet Take 1 tablet (12.5 mg total) by mouth daily.   ? medroxyPROGESTERone (PROVERA) 10 MG tablet Take 10 mg by mouth daily.   ? Multiple Vitamins-Minerals (MULTIVITAMIN WITH MINERALS) tablet Take 1 tablet by mouth daily. Bariatric   ? [DISCONTINUED] buPROPion ER (WELLBUTRIN SR) 100 MG 12 hr tablet Take 1 tablet (100 mg total) by mouth daily.   ? [DISCONTINUED] Semaglutide (RYBELSUS) 3 MG TABS Take 3 mg by mouth daily. 11/15/2021: increase rybelsus to '7mg'$  daily.   ? ?No facility-administered medications prior to visit.  ? ? ?Review of Systems  ?Constitutional:  Negative for activity change, appetite change, chills, fatigue and fever.  ?     Two pound weight gain since last visit.   ?HENT:  Negative for congestion, postnasal drip, rhinorrhea, sinus pressure, sinus pain, sneezing and sore throat.   ?Eyes: Negative.   ?Respiratory:  Negative for cough, chest tightness, shortness of breath and wheezing.   ?Cardiovascular:  Negative for chest pain  and palpitations.  ?Gastrointestinal:  Negative for abdominal pain, constipation, diarrhea, nausea and vomiting.  ?Endocrine: Negative for cold intolerance, heat intolerance, polydipsia and polyuria.  ?     Blood sugars doing well. HgbA1c 5.1 today.   ?Genitourinary:  Negative for dyspareunia, dysuria, flank pain, frequency and urgency.  ?Musculoskeletal:  Negative for arthralgias, back pain and myalgias.  ?Skin:  Negative for rash.  ?Allergic/Immunologic: Negative for environmental allergies.  ?Neurological:  Negative for dizziness, weakness and headaches.  ?Hematological:  Negative for adenopathy.  ?Psychiatric/Behavioral:  The patient is nervous/anxious.   ?     Anxiety generally well controlled. .   ? ? ? ? Objective  ?  ? ?Today's Vitals  ? 11/15/21 1456  ?BP: 106/65  ?Pulse: 92  ?Temp: 97.7 ?F (36.5 ?C)  ?SpO2: 98%  ?Weight: 221 lb (100.2 kg)  ?Height: '5\' 4"'$  (1.626 m)  ? ?Body mass index is 37.93 kg/m?.  ? ?BP Readings from Last 3 Encounters:  ?11/15/21 106/65  ?07/13/21 100/65  ?03/25/21 106/68  ?  ?Wt Readings from Last 3 Encounters:  ?11/15/21 221 lb (100.2 kg)  ?07/13/21 219 lb 9.6 oz (99.6 kg)  ?03/25/21 212 lb 12.8 oz (96.5 kg)  ?  ?Physical Exam ?Vitals and nursing note reviewed.  ?Constitutional:   ?   Appearance: Normal appearance. She is well-developed.  ?HENT:  ?   Head: Normocephalic and atraumatic.  ?   Nose: Nose normal.  ?   Mouth/Throat:  ?   Mouth: Mucous membranes are  moist.  ?   Pharynx: Oropharynx is clear.  ?Eyes:  ?   Extraocular Movements: Extraocular movements intact.  ?   Conjunctiva/sclera: Conjunctivae normal.  ?   Pupils: Pupils are equal, round, and reactive to light.  ?Cardiovascular:  ?   Rate and Rhythm: Normal rate and regular rhythm.  ?   Pulses: Normal pulses.  ?   Heart sounds: Normal heart sounds.  ?Pulmonary:  ?   Effort: Pulmonary effort is normal.  ?   Breath sounds: Normal breath sounds.  ?Abdominal:  ?   Palpations: Abdomen is soft.  ?Musculoskeletal:     ?   General:  Normal range of motion.  ?   Cervical back: Normal range of motion and neck supple.  ?Lymphadenopathy:  ?   Cervical: No cervical adenopathy.  ?Skin: ?   General: Skin is warm and dry.  ?   Capillary Refill: Capillary refill takes less than 2 seconds.  ?Neurological:  ?   General: No focal deficit present.  ?   Mental Status: She is alert and oriented to person, place, and time.  ?Psychiatric:     ?   Mood and Affect: Mood normal.     ?   Behavior: Behavior normal.     ?   Thought Content: Thought content normal.     ?   Judgment: Judgment normal.  ?  ? ? ?Results for orders placed or performed in visit on 11/15/21  ?POCT glycosylated hemoglobin (Hb A1C)  ?Result Value Ref Range  ? Hemoglobin A1C 5.1 4.0 - 5.6 %  ? HbA1c POC (<> result, manual entry)    ? HbA1c, POC (prediabetic range)    ? HbA1c, POC (controlled diabetic range)    ? ? Assessment & Plan  ?  ?1. Type 2 diabetes mellitus without complication, without long-term current use of insulin ([place]) ?HgbA1c 5.1 today but having continued weight gain. Increase Rybelsus to '7mg'$  daily. Recheck HgbA1c in 3 months.  ?- POCT glycosylated hemoglobin (Hb A1C) ?- Semaglutide (RYBELSUS) 7 MG TABS; Take 7 mg by mouth daily.  Dispense: 30 tablet; Refill: 2 ? ?2. Generalized anxiety disorder ?Increase Wellbutrin SR to '150mg'$  daily. Reassess at next visit.  ?- buPROPion ER (WELLBUTRIN SR) 150 MG 12 hr tablet; Take 1 tablet (150 mg total) by mouth daily.  Dispense: 30 tablet; Refill: 3 ? ?3. BMI 36.0-36.9,adult ?Increase Rybelsus to '7mg'$  daily. Increase Wellbutrin SR to 150 mg daily. Discussed lowering calorie intake to 1500 calories per day and incorporating exercise into daily routine to help lose weight. - buPROPion ER (WELLBUTRIN SR) 150 MG 12 hr tablet; Take 1 tablet (150 mg total) by mouth daily.  Dispense: 30 tablet; Refill: 3 ?- Semaglutide (RYBELSUS) 7 MG TABS; Take 7 mg by mouth daily.  Dispense: 30 tablet; Refill: 2  ? ? ?Problem List Items Addressed This Visit   ? ?   ? Endocrine  ? Type 2 diabetes mellitus without complication, without long-term current use of insulin ([place]) - Primary  ? Relevant Medications  ? Semaglutide (RYBELSUS) 7 MG TABS  ? Other Relevant Orders  ? POCT glycosylated hemoglobin (Hb A1C) (Completed)  ?  ? Other  ? Generalized anxiety disorder  ? Relevant Medications  ? buPROPion ER (WELLBUTRIN SR) 150 MG 12 hr tablet  ? BMI 36.0-36.9,adult  ? Relevant Medications  ? buPROPion ER (WELLBUTRIN SR) 150 MG 12 hr tablet  ? Semaglutide (RYBELSUS) 7 MG TABS  ?  ? ?Return in about 3 months (  around 02/15/2022) for DM, HTN.  ?   ? ? ? ? ?Ronnell Freshwater, NP  ?[facility] ?[phone] (phone) ?[phone] (fax) ? ?[facility]  ?

## 2021-11-15 NOTE — Patient Instructions (Signed)

## 2021-11-20 DIAGNOSIS — F411 Generalized anxiety disorder: Secondary | ICD-10-CM | POA: Insufficient documentation

## 2021-11-20 DIAGNOSIS — E119 Type 2 diabetes mellitus without complications: Secondary | ICD-10-CM | POA: Insufficient documentation

## 2021-11-20 DIAGNOSIS — Z6836 Body mass index (BMI) 36.0-36.9, adult: Secondary | ICD-10-CM | POA: Insufficient documentation

## 2021-12-01 ENCOUNTER — Other Ambulatory Visit: Payer: Self-pay | Admitting: Nurse Practitioner

## 2021-12-01 DIAGNOSIS — I10 Essential (primary) hypertension: Secondary | ICD-10-CM

## 2021-12-13 ENCOUNTER — Other Ambulatory Visit: Payer: Self-pay | Admitting: Nurse Practitioner

## 2021-12-13 DIAGNOSIS — Z6836 Body mass index (BMI) 36.0-36.9, adult: Secondary | ICD-10-CM

## 2021-12-13 DIAGNOSIS — F411 Generalized anxiety disorder: Secondary | ICD-10-CM

## 2022-01-16 ENCOUNTER — Other Ambulatory Visit: Payer: Self-pay | Admitting: Nurse Practitioner

## 2022-01-16 DIAGNOSIS — Z6836 Body mass index (BMI) 36.0-36.9, adult: Secondary | ICD-10-CM

## 2022-01-16 DIAGNOSIS — E119 Type 2 diabetes mellitus without complications: Secondary | ICD-10-CM

## 2022-01-17 NOTE — Telephone Encounter (Signed)
Changed to 90 day prescription and sent to [facility] [address] in [place], [place]

## 2022-02-14 ENCOUNTER — Other Ambulatory Visit: Payer: Self-pay | Admitting: Nurse Practitioner

## 2022-02-14 DIAGNOSIS — Z6836 Body mass index (BMI) 36.0-36.9, adult: Secondary | ICD-10-CM

## 2022-02-14 DIAGNOSIS — F411 Generalized anxiety disorder: Secondary | ICD-10-CM

## 2022-02-21 ENCOUNTER — Encounter: Payer: Self-pay | Admitting: Nurse Practitioner

## 2022-02-21 ENCOUNTER — Ambulatory Visit (INDEPENDENT_AMBULATORY_CARE_PROVIDER_SITE_OTHER): Payer: 59 | Admitting: Nurse Practitioner

## 2022-02-21 VITALS — BP 144/93 | HR 79 | Temp 98.2°F | Ht 64.0 in | Wt 220.0 lb

## 2022-02-21 DIAGNOSIS — E119 Type 2 diabetes mellitus without complications: Secondary | ICD-10-CM | POA: Diagnosis not present

## 2022-02-21 DIAGNOSIS — I10 Essential (primary) hypertension: Secondary | ICD-10-CM

## 2022-02-21 DIAGNOSIS — F411 Generalized anxiety disorder: Secondary | ICD-10-CM

## 2022-02-21 DIAGNOSIS — Z6836 Body mass index (BMI) 36.0-36.9, adult: Secondary | ICD-10-CM | POA: Diagnosis not present

## 2022-02-21 MED ORDER — HYDROCHLOROTHIAZIDE 12.5 MG PO TABS: 12.5000 mg | ORAL_TABLET | Freq: Every day | ORAL | 1 refills | Status: DC

## 2022-02-21 MED ORDER — BUPROPION HCL ER (SR) 150 MG PO TB12: 150.0000 mg | ORAL_TABLET | Freq: Every day | ORAL | 1 refills | Status: DC

## 2022-02-21 MED ORDER — RYBELSUS 14 MG PO TABS: 14.0000 mg | ORAL_TABLET | Freq: Every day | ORAL | 1 refills | Status: DC

## 2022-02-21 NOTE — Progress Notes (Signed)
Established patient visit   Patient: Rachel Gordon   DOB: 1965-11-06   56 y.o. Female  MRN: [id number] Visit Date: 02/21/2022   Chief Complaint  Patient presents with   Follow-up   Subjective    HPI  Follow up type 2 diabetes.  -taking rybelsus 7 mg daily to control sugar and help with weight.  -?increase rybelsus to 14 mg daily  Taking Bupropion XL 150 mg daily for depression/anxiety -states that this is controlling her anxiety well.  -does need to have 90 day prescriptions for all routine medications.    Medications: Outpatient Medications Prior to Visit  Medication Sig Note   CALCIUM PO Take 1 tablet by mouth 3 (three) times daily.    Multiple Vitamins-Minerals (MULTIVITAMIN WITH MINERALS) tablet Take 1 tablet by mouth daily. Bariatric    [DISCONTINUED] buPROPion (WELLBUTRIN SR) 150 MG 12 hr tablet TAKE 1 TABLET BY MOUTH EVERY DAY    [DISCONTINUED] hydrochlorothiazide (HYDRODIURIL) 12.5 MG tablet TAKE 1 TABLET BY MOUTH EVERY DAY    [DISCONTINUED] RYBELSUS 7 MG TABS TAKE 7 MG BY MOUTH DAILY. 02/21/2022: increased dose   medroxyPROGESTERone (PROVERA) 10 MG tablet Take 10 mg by mouth daily. (Patient not taking: Reported on 02/21/2022)    No facility-administered medications prior to visit.    Review of Systems  Constitutional:  Negative for activity change, appetite change, chills, fatigue and fever.       1 pound weight loss since last visit   HENT:  Negative for congestion, postnasal drip, rhinorrhea, sinus pressure, sinus pain, sneezing and sore throat.   Eyes: Negative.   Respiratory:  Negative for cough, chest tightness, shortness of breath and wheezing.   Cardiovascular:  Negative for chest pain and palpitations.       Blood pressure mildly elevated today   Gastrointestinal:  Negative for abdominal pain, constipation, diarrhea, nausea and vomiting.  Endocrine: Negative for cold intolerance, heat intolerance, polydipsia and polyuria.       Blood sugars doing well     Genitourinary:  Negative for dyspareunia, dysuria, flank pain, frequency and urgency.  Musculoskeletal:  Negative for arthralgias, back pain and myalgias.  Skin:  Negative for rash.  Allergic/Immunologic: Negative for environmental allergies.  Neurological:  Negative for dizziness, weakness and headaches.  Hematological:  Negative for adenopathy.  Psychiatric/Behavioral:  Positive for dysphoric mood. The patient is nervous/anxious.        Objective     Today's Vitals   02/21/22 1524  BP: (!) 144/93  Pulse: 79  Temp: 98.2 F (36.8 C)  TempSrc: Temporal  Weight: 220 lb (99.8 kg)  Height: '5\' 4"'$  (1.626 m)   Body mass index is 37.76 kg/m.   BP Readings from Last 3 Encounters:  02/21/22 (!) 144/93  11/15/21 106/65  07/13/21 100/65    Wt Readings from Last 3 Encounters:  02/21/22 220 lb (99.8 kg)  11/15/21 221 lb (100.2 kg)  07/13/21 219 lb 9.6 oz (99.6 kg)    Physical Exam Vitals and nursing note reviewed.  Constitutional:      Appearance: Normal appearance. She is well-developed.  HENT:     Head: Normocephalic and atraumatic.  Eyes:     Pupils: Pupils are equal, round, and reactive to light.  Cardiovascular:     Rate and Rhythm: Normal rate and regular rhythm.     Pulses: Normal pulses.     Heart sounds: Normal heart sounds.  Pulmonary:     Effort: Pulmonary effort is normal.     Breath  sounds: Normal breath sounds.  Abdominal:     Palpations: Abdomen is soft.  Musculoskeletal:        General: Normal range of motion.     Cervical back: Normal range of motion and neck supple.  Lymphadenopathy:     Cervical: No cervical adenopathy.  Skin:    General: Skin is warm and dry.     Capillary Refill: Capillary refill takes less than 2 seconds.  Neurological:     General: No focal deficit present.     Mental Status: She is alert and oriented to person, place, and time.  Psychiatric:        Mood and Affect: Mood normal.        Behavior: Behavior normal.         Thought Content: Thought content normal.        Judgment: Judgment normal.       Assessment & Plan    1. Type 2 diabetes mellitus without complication, without long-term current use of insulin ([facility]) Blood sugars doing well, but slow weight loss. Increase rybelsus to 14 mg daily. Recommend she limit carbohydrates and sugar in the diet and increase exercise. Recheck HgbA1c at next visit  - Semaglutide (RYBELSUS) 14 MG TABS; Take 1 tablet (14 mg total) by mouth daily.  Dispense: 90 tablet; Refill: 1  2. Hypertension, unspecified type Generally stable. Continue hctz and rybelsus as prescribe.d limit salt in the diet and increase exercise.  - hydrochlorothiazide (HYDRODIURIL) 12.5 MG tablet; Take 1 tablet (12.5 mg total) by mouth daily.  Dispense: 90 tablet; Refill: 1 - Semaglutide (RYBELSUS) 14 MG TABS; Take 1 tablet (14 mg total) by mouth daily.  Dispense: 90 tablet; Refill: 1  3. BMI 36.0-36.9,adult Increase rybelsus to 14 mg daily. Discussed lowering calorie intake to 1500 calories per day and incorporating exercise into daily routine to help lose weight.  - buPROPion (WELLBUTRIN SR) 150 MG 12 hr tablet; Take 1 tablet (150 mg total) by mouth daily.  Dispense: 90 tablet; Refill: 1 - Semaglutide (RYBELSUS) 14 MG TABS; Take 1 tablet (14 mg total) by mouth daily.  Dispense: 90 tablet; Refill: 1  4. Generalized anxiety disorder Conitnue wellbutrin XR 150 mg daily  - buPROPion (WELLBUTRIN SR) 150 MG 12 hr tablet; Take 1 tablet (150 mg total) by mouth daily.  Dispense: 90 tablet; Refill: 1   Problem List Items Addressed This Visit       Cardiovascular and Mediastinum   Hypertension   Relevant Medications   hydrochlorothiazide (HYDRODIURIL) 12.5 MG tablet   Semaglutide (RYBELSUS) 14 MG TABS     Endocrine   Type 2 diabetes mellitus without complication, without long-term current use of insulin ([facility]) - Primary   Relevant Medications   Semaglutide (RYBELSUS) 14 MG TABS     Other    Generalized anxiety disorder   Relevant Medications   buPROPion (WELLBUTRIN SR) 150 MG 12 hr tablet   BMI 36.0-36.9,adult   Relevant Medications   buPROPion (WELLBUTRIN SR) 150 MG 12 hr tablet   Semaglutide (RYBELSUS) 14 MG TABS     Return in about 2 months (around 04/23/2022) for diabetes with HgbA1c check.         Ronnell Freshwater, NP  [facility] [phone]) [phone] (fax)  [place]

## 2022-02-23 ENCOUNTER — Other Ambulatory Visit: Payer: Self-pay

## 2022-02-23 DIAGNOSIS — I10 Essential (primary) hypertension: Secondary | ICD-10-CM

## 2022-02-23 DIAGNOSIS — Z6836 Body mass index (BMI) 36.0-36.9, adult: Secondary | ICD-10-CM

## 2022-02-23 DIAGNOSIS — E119 Type 2 diabetes mellitus without complications: Secondary | ICD-10-CM

## 2022-02-23 MED ORDER — HYDROCHLOROTHIAZIDE 12.5 MG PO TABS: 12.5000 mg | ORAL_TABLET | Freq: Every day | ORAL | 1 refills | Status: DC

## 2022-02-23 MED ORDER — RYBELSUS 14 MG PO TABS: 14.0000 mg | ORAL_TABLET | Freq: Every day | ORAL | 1 refills | Status: DC

## 2022-04-25 ENCOUNTER — Ambulatory Visit (INDEPENDENT_AMBULATORY_CARE_PROVIDER_SITE_OTHER): Payer: 59 | Admitting: Nurse Practitioner

## 2022-04-25 ENCOUNTER — Encounter: Payer: Self-pay | Admitting: Nurse Practitioner

## 2022-04-25 VITALS — BP 112/71 | HR 80 | Ht 64.0 in | Wt 215.8 lb

## 2022-04-25 DIAGNOSIS — D509 Iron deficiency anemia, unspecified: Secondary | ICD-10-CM

## 2022-04-25 DIAGNOSIS — F411 Generalized anxiety disorder: Secondary | ICD-10-CM | POA: Diagnosis not present

## 2022-04-25 DIAGNOSIS — R5383 Other fatigue: Secondary | ICD-10-CM

## 2022-04-25 DIAGNOSIS — Z23 Encounter for immunization: Secondary | ICD-10-CM | POA: Diagnosis not present

## 2022-04-25 DIAGNOSIS — E119 Type 2 diabetes mellitus without complications: Secondary | ICD-10-CM

## 2022-04-25 DIAGNOSIS — Z6836 Body mass index (BMI) 36.0-36.9, adult: Secondary | ICD-10-CM

## 2022-04-25 DIAGNOSIS — E559 Vitamin D deficiency, unspecified: Secondary | ICD-10-CM

## 2022-04-25 DIAGNOSIS — Z0000 Encounter for general adult medical examination without abnormal findings: Secondary | ICD-10-CM

## 2022-04-25 LAB — POCT GLYCOSYLATED HEMOGLOBIN (HGB A1C): Hemoglobin A1C: 5 % (ref 4.0–5.6)

## 2022-04-25 MED ORDER — BUPROPION HCL ER (SR) 150 MG PO TB12: 150.0000 mg | ORAL_TABLET | Freq: Every day | ORAL | 1 refills | Status: DC

## 2022-04-25 NOTE — Progress Notes (Signed)
Established patient visit   Patient: Rachel Gordon   DOB: 1966/07/14   56 y.o. Female  MRN: [id number] Visit Date: 04/25/2022   Chief Complaint  Patient presents with   Follow-up   Diabetes   Subjective    HPI  Follow up  -type 2 diabetes  -currently on Rybelsus 14 mg daily  -most recent HgbA1c  5.1  -HgbA1c 04/25/2022 - 5.0  Takes bupropion to help with anxiety and depression. Also improves concentration  -goes to [facility] for well-woman exam.  Due for eye exam - will schedule appointment.   Medications: Outpatient Medications Prior to Visit  Medication Sig   CALCIUM PO Take 1 tablet by mouth 3 (three) times daily.   hydrochlorothiazide (HYDRODIURIL) 12.5 MG tablet Take 1 tablet (12.5 mg total) by mouth daily.   Multiple Vitamins-Minerals (MULTIVITAMIN WITH MINERALS) tablet Take 1 tablet by mouth daily. Bariatric   Semaglutide (RYBELSUS) 14 MG TABS Take 1 tablet (14 mg total) by mouth daily.   [DISCONTINUED] buPROPion (WELLBUTRIN SR) 150 MG 12 hr tablet Take 1 tablet (150 mg total) by mouth daily.   No facility-administered medications prior to visit.    Review of Systems  Constitutional:  Negative for activity change, appetite change, chills, fatigue and fever.       Fivbe pound weight loss since last visit   HENT:  Negative for congestion, postnasal drip, rhinorrhea, sinus pressure, sinus pain, sneezing and sore throat.   Eyes: Negative.   Respiratory:  Negative for cough, chest tightness, shortness of breath and wheezing.   Cardiovascular:  Negative for chest pain and palpitations.  Gastrointestinal:  Negative for abdominal pain, constipation, diarrhea, nausea and vomiting.  Endocrine: Negative for cold intolerance, heat intolerance, polydipsia and polyuria.       Blood sugars doing well    Genitourinary:  Negative for dyspareunia, dysuria, flank pain, frequency and urgency.  Musculoskeletal:  Negative for arthralgias, back pain and myalgias.   Skin:  Negative for rash.  Allergic/Immunologic: Negative for environmental allergies.  Neurological:  Negative for dizziness, weakness and headaches.  Hematological:  Negative for adenopathy.  Psychiatric/Behavioral:  The patient is not nervous/anxious.        Objective     Today's Vitals   04/25/22 1417  BP: 112/71  Pulse: 80  SpO2: 100%  Weight: 215 lb 12.8 oz (97.9 kg)  Height: '5\' 4"'$  (1.626 m)   Body mass index is 37.04 kg/m.  BP Readings from Last 3 Encounters:  04/25/22 112/71  02/21/22 (Abnormal) 144/93  11/15/21 106/65    Wt Readings from Last 3 Encounters:  04/25/22 215 lb 12.8 oz (97.9 kg)  02/21/22 220 lb (99.8 kg)  11/15/21 221 lb (100.2 kg)    Physical Exam Vitals and nursing note reviewed.  Constitutional:      Appearance: Normal appearance. She is well-developed.  HENT:     Head: Normocephalic and atraumatic.  Eyes:     Pupils: Pupils are equal, round, and reactive to light.  Cardiovascular:     Rate and Rhythm: Normal rate and regular rhythm.     Pulses: Normal pulses.     Heart sounds: Normal heart sounds.  Pulmonary:     Effort: Pulmonary effort is normal.     Breath sounds: Normal breath sounds.  Abdominal:     Palpations: Abdomen is soft.  Musculoskeletal:        General: Normal range of motion.     Cervical back: Normal range of motion and neck supple.  Lymphadenopathy:     Cervical: No cervical adenopathy.  Skin:    General: Skin is warm and dry.     Capillary Refill: Capillary refill takes less than 2 seconds.  Neurological:     General: No focal deficit present.     Mental Status: She is alert and oriented to person, place, and time.  Psychiatric:        Mood and Affect: Mood normal.        Behavior: Behavior normal.        Thought Content: Thought content normal.        Judgment: Judgment normal.      Results for orders placed or performed in visit on 04/25/22  POCT glycosylated hemoglobin (Hb A1C)  Result Value Ref  Range   Hemoglobin A1C 5.0 4.0 - 5.6 %   HbA1c POC (<> result, manual entry)     HbA1c, POC (prediabetic range)     HbA1c, POC (controlled diabetic range)      Assessment & Plan    1. Type 2 diabetes mellitus without complication, without long-term current use of insulin (HCC) HgbA1c 5.0 today. Continue rybelsus 14 mg daily.  Recheck 4 months  - POCT glycosylated hemoglobin (Hb A1C) - Lipid panel - Comprehensive metabolic panel  2. BMI 36.0-36.9,adult Improving with 5 pound weight loss since last visit. Continue rybelsus and bupropion. Continue with 1500 calorie diet. Recommend addition of exercise into daily routine.  - buPROPion (WELLBUTRIN SR) 150 MG 12 hr tablet; Take 1 tablet (150 mg total) by mouth daily.  Dispense: 90 tablet; Refill: 1  3. Other fatigue Check thyroid panel  - TSH + free T4  4. Generalized anxiety disorder Stable. Continue bupropion as prescribed.  - buPROPion (WELLBUTRIN SR) 150 MG 12 hr tablet; Take 1 tablet (150 mg total) by mouth daily.  Dispense: 90 tablet; Refill: 1  5. Vitamin D deficiency Check vitamin d level and treat deficiency as indicated  - TSH + free T4 - VITAMIN D 25 Hydroxy (Vit-D Deficiency, Fractures)  6. Need for influenza vaccination Flu vaccine administered during today's visit  - Flu Vaccine QUAD 6+ mos PF IM (Fluarix Quad PF)  7. Healthcare maintenance Routine, fasting labs ordered during today's visit.  - TSH + free T4 - Lipid panel - Comprehensive metabolic panel - CBC    Problem List Items Addressed This Visit       Endocrine   Type 2 diabetes mellitus without complication, without long-term current use of insulin (HCC) - Primary   Relevant Orders   POCT glycosylated hemoglobin (Hb A1C) (Completed)   Lipid panel   Comprehensive metabolic panel     Other   Fatigue   Relevant Orders   TSH + free T4   Vitamin D deficiency   Relevant Orders   TSH + free T4   VITAMIN D 25 Hydroxy (Vit-D Deficiency, Fractures)    Generalized anxiety disorder   Relevant Medications   buPROPion (WELLBUTRIN SR) 150 MG 12 hr tablet   BMI 36.0-36.9,adult   Relevant Medications   buPROPion (WELLBUTRIN SR) 150 MG 12 hr tablet   Other Visit Diagnoses     Need for influenza vaccination       Relevant Orders   Flu Vaccine QUAD 6+ mos PF IM (Fluarix Quad PF) (Completed)   Healthcare maintenance       Relevant Orders   TSH + free T4   Lipid panel   Comprehensive metabolic panel   CBC  Return in about 4 months (around 08/26/2022) for diabetes with HgbA1c check - first shingles vaccine .         Ronnell Freshwater, NP  [facility] at [facility] [phone] (phone) [phone] (fax)  [place]

## 2022-05-27 ENCOUNTER — Encounter (HOSPITAL_COMMUNITY): Payer: Self-pay | Admitting: *Deleted

## 2022-06-18 LAB — CBC
Hematocrit: 28.9 % — ABNORMAL LOW (ref 34.0–46.6)
Hemoglobin: 7.8 g/dL — ABNORMAL LOW (ref 11.1–15.9)
MCH: 16.7 pg — ABNORMAL LOW (ref 26.6–33.0)
MCHC: 27 g/dL — ABNORMAL LOW (ref 31.5–35.7)
MCV: 62 fL — ABNORMAL LOW (ref 79–97)
Platelets: 369 10*3/uL (ref 150–450)
RBC: 4.67 x10E6/uL (ref 3.77–5.28)
RDW: 17.4 % — ABNORMAL HIGH (ref 11.7–15.4)
WBC: 4.4 10*3/uL (ref 3.4–10.8)

## 2022-06-18 LAB — COMPREHENSIVE METABOLIC PANEL
ALT: 17 IU/L (ref 0–32)
AST: 17 IU/L (ref 0–40)
Albumin/Globulin Ratio: 1.7 (ref 1.2–2.2)
Albumin: 4.4 g/dL (ref 3.8–4.9)
Alkaline Phosphatase: 67 IU/L (ref 44–121)
BUN/Creatinine Ratio: 20 (ref 9–23)
BUN: 19 mg/dL (ref 6–24)
Bilirubin Total: 0.4 mg/dL (ref 0.0–1.2)
CO2: 23 mmol/L (ref 20–29)
Calcium: 9.5 mg/dL (ref 8.7–10.2)
Chloride: 98 mmol/L (ref 96–106)
Creatinine, Ser: 0.93 mg/dL (ref 0.57–1.00)
Globulin, Total: 2.6 g/dL (ref 1.5–4.5)
Glucose: 103 mg/dL — ABNORMAL HIGH (ref 70–99)
Potassium: 3.6 mmol/L (ref 3.5–5.2)
Sodium: 137 mmol/L (ref 134–144)
Total Protein: 7 g/dL (ref 6.0–8.5)
eGFR: 73 mL/min/{1.73_m2} (ref >59)

## 2022-06-18 LAB — VITAMIN D 25 HYDROXY (VIT D DEFICIENCY, FRACTURES): Vit D, 25-Hydroxy: 31.8 ng/mL (ref 30.0–100.0)

## 2022-06-18 LAB — LIPID PANEL
Chol/HDL Ratio: 2.2 ratio (ref 0.0–4.4)
Cholesterol, Total: 199 mg/dL (ref 100–199)
HDL: 90 mg/dL (ref >39)
LDL Chol Calc (NIH): 94 mg/dL (ref 0–99)
Triglycerides: 82 mg/dL (ref 0–149)
VLDL Cholesterol Cal: 15 mg/dL (ref 5–40)

## 2022-06-18 LAB — TSH+FREE T4
Free T4: 1.22 ng/dL (ref 0.82–1.77)
TSH: 1.73 u[IU]/mL (ref 0.450–4.500)

## 2022-06-19 NOTE — Progress Notes (Signed)
Please let the patient  know that her labs are back and shows she have significant anemia. Can you ask if she has had recent bleeding? Has she ever seen hematology? She needs to hae referral for further evaluation. Thanks  -HB

## 2022-06-20 NOTE — Addendum Note (Signed)
Addended by: Gemma Payor on: 06/20/2022 08:16 AM   Modules accepted: Orders

## 2022-07-29 NOTE — Progress Notes (Unsigned)
[place], [place] [address] History  CLINIC:  Medical Oncology/Hematology  CONSULT NOTE  Patient Care Team: Ronnell Freshwater, NP as PCP - General (Family Medicine)  CHIEF COMPLAINTS/PURPOSE OF CONSULTATION:  Severe iron deficiency anemia   HISTORY OF PRESENTING ILLNESS:   Rachel Gordon 57 y.o. female is here at the request of her primary care provider Leretha Pol NP) for evaluation and treatment of iron deficiency anemia.  Labs by PCP from 06/17/2022 showed severe microcytic anemia with Hgb 7.8 and MCV 62.  WBC and platelets were normal.  Labs from outside facility (06/17/2022) show severe iron deficiency with ferritin 4, iron saturation 3% and TIBC 517.  Normal folate 13.8, normal B12 844.  (Labs the year prior showed normal Hgb 12.5 in December 2022.  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  She has had regular menstrual periods for the past year (up until October 2023, with some light spotting since that time); menses described as heavy secondary to PCOS.  No obvious rectal bleeding or melena.  She reports mild fatigue reports that she tires easily but is still able to complete her ADLs and work duties.  She has pica cravings for ice and some lightheadedness with standing.  He has been taking iron tablet daily for the past month.  She has never had any IV iron or blood transfusion in the past.  She does not take any PPI, blood thinner, or NSAIDs.  No history of blood donation.  She has never had EGD.  She receives colonoscopies every 5 years due to strong family history of colon cancer, last performed in 2019 by Dr. Gustavo Lah [facility]).  Her past medical history is otherwise significant for type 2 diabetes mellitus, PCOS, obesity, anxiety, and vitamin D deficiency.  She lives with a roommate.  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  Prior to gastric surgery she would drink about 2-3 alcoholic beverages each week, but no longer drinks alcohol.  No history of tobacco or illicit drug use.  Family history is significant for colon cancer in both parents (mother diagnosed at age 66, father diagnosed at age 41).  Maternal grandmother had breast cancer.   MEDICAL HISTORY:  Past Medical History:  Diagnosis Date   Abnormal uterine bleeding (AUB)    Anemia    Chronic kidney disease    DM (diabetes mellitus) ([place])    h/o had bariatric surgery in 2021 and is a1c is now 5.4 as of 01-15-21   Fibroid    GERD (gastroesophageal reflux disease)    Headache    h/o   History of hiatal hernia    Hyperlipidemia    Hyperlipidemia    Hypertension    h/o had bariatric sx in 2021 and has been off meds since   Metrorrhagia    Obesity    bmi 54   PCOS (polycystic ovarian syndrome)    S/P endometrial ablation    Sleep apnea    h/o has been off cpap since bariatric sx in 2021   Vaginal Pap smear, abnormal 2009   pos hvp    SURGICAL HISTORY: Past Surgical History:  Procedure Laterality Date   COLONOSCOPY     DILATATION & CURETTAGE/HYSTEROSCOPY WITH MYOSURE N/A 11/28/2014   Procedure: DILATATION & CURETTAGE/HYSTEROSCOPY WITH MYOSURE;  Surgeon: Estill Bamberg  Glennon Mac, MD;  Location: [facility] ORS;  Service: Gynecology;  Laterality: N/A;   DILATATION & CURETTAGE/HYSTEROSCOPY WITH MYOSURE N/A 01/26/2021   Procedure: DILATATION & CURETTAGE/HYSTEROSCOPY WITH MYOSURE;  Surgeon: Will Bonnet, MD;  Location: [facility] ORS;  Service: Gynecology;  Laterality: N/A;   DILATION AND CURETTAGE OF UTERUS  2002   LAPAROSCOPIC GASTRIC SLEEVE RESECTION N/A 10/29/2019   Procedure: LAPAROSCOPIC GASTRIC SLEEVE RESECTION, Upper Endo, ERAS Pathway;  Surgeon: Johnathan Hausen, MD;  Location: [facility];  Service: General;  Laterality: N/A;   NOVASURE ABLATION     TONSILLECTOMY     age 7    SOCIAL HISTORY: Social  History   Socioeconomic History   Marital status: Single    Spouse name: Not on file   Number of children: Not on file   Years of education: Not on file   Highest education level: Not on file  Occupational History   Not on file  Tobacco Use   Smoking status: Never   Smokeless tobacco: Never  Vaping Use   Vaping Use: Never used  Substance and Sexual Activity   Alcohol use: Not Currently    Comment: socially   Drug use: No   Sexual activity: Yes    Partners: Male    Birth control/protection: None  Other Topics Concern   Not on file  Social History Narrative   Not on file   Social Determinants of Health   Financial Resource Strain: Not on file  Food Insecurity: Not on file  Transportation Needs: Not on file  Physical Activity: Not on file  Stress: Not on file  Social Connections: Not on file  Intimate Partner Violence: Not on file    FAMILY HISTORY: Family History  Problem Relation Age of Onset   Colon cancer Mother 51   Colon cancer Father 59   Breast cancer Maternal Grandmother 61    ALLERGIES:  has No Known Allergies.  MEDICATIONS:  Current Outpatient Medications  Medication Sig Dispense Refill   buPROPion (WELLBUTRIN SR) 150 MG 12 hr tablet Take 1 tablet (150 mg total) by mouth daily. 90 tablet 1   CALCIUM PO Take 1 tablet by mouth 3 (three) times daily.     hydrochlorothiazide (HYDRODIURIL) 12.5 MG tablet Take 1 tablet (12.5 mg total) by mouth daily. 90 tablet 1   Multiple Vitamins-Minerals (MULTIVITAMIN WITH MINERALS) tablet Take 1 tablet by mouth daily. Bariatric     Semaglutide (RYBELSUS) 14 MG TABS Take 1 tablet (14 mg total) by mouth daily. 90 tablet 1   No current facility-administered medications for this visit.    REVIEW OF SYSTEMS:    Review of Systems  Constitutional:  Positive for fatigue (energy 75%). Negative for appetite change, chills, diaphoresis, fever and unexpected weight change.  HENT:   Negative for lump/mass and nosebleeds.    Eyes:  Negative for eye problems.  Respiratory:  Negative for cough, hemoptysis and shortness of breath.   Cardiovascular:  Negative for chest pain, leg swelling and palpitations.  Gastrointestinal:  Positive for constipation. Negative for abdominal pain, blood in stool, diarrhea, nausea and vomiting.  Genitourinary:  Negative for hematuria.   Skin: Negative.   Neurological:  Negative for dizziness, headaches and light-headedness.  Hematological:  Does not bruise/bleed easily.      PHYSICAL EXAMINATION:   ECOG PERFORMANCE STATUS: 1 - Symptomatic but completely ambulatory  There were no vitals filed for this visit. There were no vitals filed for this visit.  Physical Exam Constitutional:  Appearance: Normal appearance. She is obese.  HENT:     Head: Normocephalic and atraumatic.     Mouth/Throat:     Mouth: Mucous membranes are moist.  Eyes:     Extraocular Movements: Extraocular movements intact.     Pupils: Pupils are equal, round, and reactive to light.  Cardiovascular:     Rate and Rhythm: Normal rate and regular rhythm.     Pulses: Normal pulses.     Heart sounds: Normal heart sounds.  Pulmonary:     Effort: Pulmonary effort is normal.     Breath sounds: Normal breath sounds.  Abdominal:     General: Bowel sounds are normal.     Palpations: Abdomen is soft.     Tenderness: There is no abdominal tenderness.  Musculoskeletal:        General: No swelling.     Right lower leg: No edema.     Left lower leg: No edema.  Lymphadenopathy:     Cervical: No cervical adenopathy.  Skin:    General: Skin is warm and dry.  Neurological:     General: No focal deficit present.     Mental Status: She is alert and oriented to person, place, and time.  Psychiatric:        Mood and Affect: Mood normal.        Behavior: Behavior normal.       LABORATORY DATA:  I have reviewed the data as listed Recent Results (from the past 2160 hour(s))  TSH + free T4     Status: None    Collection Time: 06/17/22 10:06 AM  Result Value Ref Range   TSH 1.730 0.450 - 4.500 uIU/mL   Free T4 1.22 0.82 - 1.77 ng/dL  VITAMIN D 25 Hydroxy (Vit-D Deficiency, Fractures)     Status: None   Collection Time: 06/17/22 10:06 AM  Result Value Ref Range   Vit D, 25-Hydroxy 31.8 30.0 - 100.0 ng/mL    Comment: Vitamin D deficiency has been defined by the [place] practice guideline as a level of serum 25-OH vitamin D less than 20 ng/mL (1,2). The Endocrine Society went on to further define vitamin D insufficiency as a level between 21 and 29 ng/mL (2). 1. IOM (Institute of Medicine). 2010. Dietary reference    intakes for calcium and D. [place]: The    [employer]. 2. Holick MF, Binkley , Bischoff-Ferrari HA, et al.    Evaluation, treatment, and prevention of vitamin D    deficiency: an Endocrine Society clinical practice    guideline. JCEM. 2011 Jul; 96(7):1911-30.   Lipid panel     Status: None   Collection Time: 06/17/22 10:06 AM  Result Value Ref Range   Cholesterol, Total 199 100 - 199 mg/dL   Triglycerides 82 0 - 149 mg/dL   HDL 90 >39 mg/dL   VLDL Cholesterol Cal 15 5 - 40 mg/dL   LDL Chol Calc (NIH) 94 0 - 99 mg/dL   Chol/HDL Ratio 2.2 0.0 - 4.4 ratio    Comment:                                   T. Chol/HDL Ratio  Men  Women                               1/2 Avg.Risk  3.4    3.3                                   Avg.Risk  5.0    4.4                                2X Avg.Risk  9.6    7.1                                3X Avg.Risk 23.4   11.0   Comprehensive metabolic panel     Status: Abnormal   Collection Time: 06/17/22 10:06 AM  Result Value Ref Range   Glucose 103 (H) 70 - 99 mg/dL   BUN 19 6 - 24 mg/dL   Creatinine, Ser 0.93 0.57 - 1.00 mg/dL   eGFR 73 >59 mL/min/1.73   BUN/Creatinine Ratio 20 9 - 23   Sodium 137 134 - 144 mmol/L   Potassium 3.6 3.5 - 5.2  mmol/L   Chloride 98 96 - 106 mmol/L   CO2 23 20 - 29 mmol/L   Calcium 9.5 8.7 - 10.2 mg/dL   Total Protein 7.0 6.0 - 8.5 g/dL   Albumin 4.4 3.8 - 4.9 g/dL   Globulin, Total 2.6 1.5 - 4.5 g/dL   Albumin/Globulin Ratio 1.7 1.2 - 2.2   Bilirubin Total 0.4 0.0 - 1.2 mg/dL   Alkaline Phosphatase 67 44 - 121 IU/L   AST 17 0 - 40 IU/L   ALT 17 0 - 32 IU/L  CBC     Status: Abnormal   Collection Time: 06/17/22 10:06 AM  Result Value Ref Range   WBC 4.4 3.4 - 10.8 x10E3/uL   RBC 4.67 3.77 - 5.28 x10E6/uL   Hemoglobin 7.8 (L) 11.1 - 15.9 g/dL   Hematocrit 28.9 (L) 34.0 - 46.6 %   MCV 62 (L) 79 - 97 fL   MCH 16.7 (L) 26.6 - 33.0 pg   MCHC 27.0 (L) 31.5 - 35.7 g/dL   RDW 17.4 (H) 11.7 - 15.4 %   Platelets 369 150 - 450 x10E3/uL    RADIOGRAPHIC STUDIES: I have personally reviewed the radiological images as listed and agreed with the findings in the report. No results found.   ASSESSMENT & PLAN:  1.  Severe iron deficiency anemia - Seen at the request of her primary care provider Leretha Pol NP) for evaluation and treatment of iron deficiency anemia. - EMR lab review:  CBC (06/17/2022) Hgb 7.8 and MCV 62.  WBC and platelets were normal. Iron panel (06/17/2022) ferritin 4, iron saturation 3% and TIBC 517. Normal folate 13.8, normal B12 844. Labs the year prior showed normal Hgb 12.5 in December 2022.  She has had microcytosis ever since bariatric surgery in April 2021. 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 - Regular menstrual periods for the past year (up until October 2023, with light spotting since that time); menses described as heavy secondary to PCOS. - No obvious rectal bleeding or melena. - Taking daily iron tablet since December 2023 - She has never had EGD.  She receives colonoscopies every 5 years due to strong family history of colon cancer, last performed in 2019 by Dr. Gustavo Lah [facility]).  - PLAN: Labs today to include CBC/D+ BB sample, copper,  ferritin, iron/TIBC.  We will schedule for transfusion if indicated.   - We will schedule patient for IV iron with Venofer 300 mg x 3 - Repeat CBC with iron panel in 8 weeks.   -- Stool cards x 3  2.  Other history - PMH: Type 2 diabetes mellitus, PCOS, obesity, anxiety, and vitamin D deficiency.  - FAMILY:  Lives with a roommate.  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  Prior to gastric surgery she would drink about 2-3 alcoholic beverages each week, but no longer drinks alcohol.  No history of tobacco or illicit drug use.  - SOCIAL: Colon Cancer in both parents (mother diagnosed at age 63, father diagnosed at age 64). Maternal grandmother had breast cancer.    PLAN SUMMARY: >> Stool cards x 3 >> Labs TODAY (CBC/D, BB sample, copper, ferritin, iron/TIBC) >> Venofer 300 mg x 3 (MONDAYS or FRIDAYS preferred) >> Same-day labs (CBC/D, ferritin, iron/TIBC) + OFFICE visit in 8 to 10 weeks    All questions were answered. The patient knows to call the clinic with any problems, questions or concerns.  Medical decision making: Moderate  Time spent on visit: I spent 35 minutes counseling the patient face to face. The total time spent in the appointment was 55 minutes and more than 50% was on counseling.  I, Tarri Abernethy PA-C, have seen this patient in conjunction with Dr. Derek Jack.  Greater than 50% of visit was performed by Dr. Delton Coombes.   Harriett Rush, PA-C 08/01/2022 10:25 AM  DR. Alyda Megna: I have independently evaluated this patient and formulated my assessment and plan.  I agree with HPI, assessment and plan written by Casey Burkitt, PA-C.  Patient evaluated for severe microcytic anemia from iron deficiency in the setting of gastric sleeve surgery couple of years ago.  She started taking iron tablet a month ago.  Will repeat labs today and see if there is any improvement.  If not, will  consider parenteral iron therapy.  Will also check for coexisting nutritional deficiencies.  ADDENDUM: Patient hemoglobin has come up to 11.4 after a month of oral iron supplementation.  Therefore, we will CANCEL IV iron for the time being.  Recommend continuing oral iron supplementation.  Will recheck levels in 8 to 10 weeks and consider IV iron if indicated at that time.

## 2022-08-01 ENCOUNTER — Inpatient Hospital Stay: Payer: 59 | Attending: Hematology | Admitting: Hematology

## 2022-08-01 ENCOUNTER — Encounter: Payer: Self-pay | Admitting: Physician Assistant

## 2022-08-01 ENCOUNTER — Inpatient Hospital Stay: Payer: 59

## 2022-08-01 ENCOUNTER — Encounter: Payer: Self-pay | Admitting: Hematology

## 2022-08-01 VITALS — BP 133/88 | HR 70 | Temp 97.9°F | Resp 18 | Ht 64.0 in | Wt 214.7 lb

## 2022-08-01 DIAGNOSIS — Z9884 Bariatric surgery status: Secondary | ICD-10-CM

## 2022-08-01 DIAGNOSIS — D509 Iron deficiency anemia, unspecified: Secondary | ICD-10-CM | POA: Diagnosis not present

## 2022-08-01 DIAGNOSIS — Z803 Family history of malignant neoplasm of breast: Secondary | ICD-10-CM

## 2022-08-01 DIAGNOSIS — E119 Type 2 diabetes mellitus without complications: Secondary | ICD-10-CM

## 2022-08-01 DIAGNOSIS — F419 Anxiety disorder, unspecified: Secondary | ICD-10-CM | POA: Diagnosis not present

## 2022-08-01 DIAGNOSIS — Z800 Family history of malignant neoplasm of digestive organs: Secondary | ICD-10-CM | POA: Diagnosis not present

## 2022-08-01 DIAGNOSIS — E282 Polycystic ovarian syndrome: Secondary | ICD-10-CM | POA: Diagnosis not present

## 2022-08-01 DIAGNOSIS — E669 Obesity, unspecified: Secondary | ICD-10-CM | POA: Diagnosis not present

## 2022-08-01 DIAGNOSIS — E559 Vitamin D deficiency, unspecified: Secondary | ICD-10-CM

## 2022-08-01 LAB — CBC WITH DIFFERENTIAL/PLATELET
Abs Immature Granulocytes: 0.01 10*3/uL (ref 0.00–0.07)
Basophils Absolute: 0.1 10*3/uL (ref 0.0–0.1)
Basophils Relative: 1 %
Eosinophils Absolute: 0.1 10*3/uL (ref 0.0–0.5)
Eosinophils Relative: 2 %
HCT: 39.6 % (ref 36.0–46.0)
Hemoglobin: 11.4 g/dL — ABNORMAL LOW (ref 12.0–15.0)
Immature Granulocytes: 0 %
Lymphocytes Relative: 20 %
Lymphs Abs: 1.1 10*3/uL (ref 0.7–4.0)
MCH: 21 pg — ABNORMAL LOW (ref 26.0–34.0)
MCHC: 28.8 g/dL — ABNORMAL LOW (ref 30.0–36.0)
MCV: 72.9 fL — ABNORMAL LOW (ref 80.0–100.0)
Monocytes Absolute: 0.4 10*3/uL (ref 0.1–1.0)
Monocytes Relative: 8 %
Neutro Abs: 3.8 10*3/uL (ref 1.7–7.7)
Neutrophils Relative %: 69 %
Platelets: 315 10*3/uL (ref 150–400)
RBC: 5.43 MIL/uL — ABNORMAL HIGH (ref 3.87–5.11)
WBC: 5.5 10*3/uL (ref 4.0–10.5)
nRBC: 0 % (ref 0.0–0.2)

## 2022-08-01 LAB — IRON AND TIBC
Iron: 26 ug/dL — ABNORMAL LOW (ref 28–170)
Saturation Ratios: 5 % — ABNORMAL LOW (ref 10.4–31.8)
TIBC: 533 ug/dL — ABNORMAL HIGH (ref 250–450)
UIBC: 507 ug/dL

## 2022-08-01 LAB — FERRITIN: Ferritin: 10 ng/mL — ABNORMAL LOW (ref 11–307)

## 2022-08-01 LAB — SAMPLE TO BLOOD BANK

## 2022-08-01 NOTE — Patient Instructions (Addendum)
[facility] [place] **   You were seen today by Dr. Delton Coombes and Tarri Abernethy PA-C for your iron deficiency anemia.   Your iron deficiency anemia is likely a combination of MALABSORPTION (history of sleeve gastrectomy) and BLOOD LOSS (menstrual blood loss). Continue to take iron tablet daily. We will schedule you for IV iron x 3 doses. Please complete stool cards x 3 and return them to the [facility] once complete. We will recheck your labs in 8-10 weeks   LABS: Check labs TODAY before leaving the hospital building (stop at the main desk on the first floor)  FOLLOW-UP APPOINTMENT: Same-day labs and office visit in 8 to 10 weeks   ** Thank you for trusting me with your healthcare!  I strive to provide all of my patients with quality care at each visit.  If you receive a survey for this visit, I would be so grateful to you for taking the time to provide feedback.  Thank you in advance!  ~ Kouper Spinella                   Dr. Derek Jack   &   Tarri Abernethy, PA-C   - - - - - - - - - - - - - - - - - -    Thank you for choosing [facility] at [facility] to provide your oncology and hematology care.  To afford each patient quality time with our provider, please arrive at least 15 minutes before your scheduled appointment time.   If you have a lab appointment with the [facility] please come in thru the Main Entrance and check in at the main information desk.  You need to re-schedule your appointment should you arrive 10 or more minutes late.  We strive to give you quality time with our providers, and arriving late affects you and other patients whose appointments are after yours.  Also, if you no show three or more times for appointments you may be dismissed from the clinic at the providers discretion.     Again, thank you for choosing [facility].  Our hope is that these  requests will decrease the amount of time that you wait before being seen by our physicians.       _____________________________________________________________  Should you have questions after your visit to [facility], please contact our office at [phone] and follow the prompts.  Our office hours are 8:00 a.m. and 4:30 p.m. Monday - Friday.  Please note that voicemails left after 4:00 p.m. may not be returned until the following business day.  We are closed weekends and major holidays.  You do have access to a nurse 24-7, just call the main number to the clinic [phone] and do not press any options, hold on the line and a nurse will answer the phone.    For prescription refill requests, have your pharmacy contact our office and allow 72 hours.

## 2022-08-02 ENCOUNTER — Other Ambulatory Visit: Payer: Self-pay

## 2022-08-02 DIAGNOSIS — D509 Iron deficiency anemia, unspecified: Secondary | ICD-10-CM

## 2022-08-04 LAB — COPPER, SERUM: Copper: 118 ug/dL (ref 80–158)

## 2022-08-08 ENCOUNTER — Ambulatory Visit: Payer: 59

## 2022-08-12 ENCOUNTER — Ambulatory Visit: Payer: 59

## 2022-08-15 ENCOUNTER — Ambulatory Visit: Payer: 59

## 2022-08-15 ENCOUNTER — Other Ambulatory Visit (HOSPITAL_COMMUNITY)
Admission: RE | Admit: 2022-08-15 | Discharge: 2022-08-15 | Disposition: A | Discharge status: Home / Self Care | Payer: 59 | Source: Ambulatory Visit | Attending: Physician Assistant | Admitting: Physician Assistant

## 2022-08-15 DIAGNOSIS — D509 Iron deficiency anemia, unspecified: Secondary | ICD-10-CM | POA: Insufficient documentation

## 2022-08-15 LAB — OCCULT BLOOD X 1 CARD TO LAB, STOOL
Fecal Occult Bld: NEGATIVE
Fecal Occult Bld: NEGATIVE
Fecal Occult Bld: NEGATIVE

## 2022-08-19 ENCOUNTER — Ambulatory Visit: Payer: 59

## 2022-08-22 ENCOUNTER — Ambulatory Visit: Payer: 59

## 2022-08-26 ENCOUNTER — Ambulatory Visit: Payer: 59

## 2022-08-29 ENCOUNTER — Encounter: Payer: Self-pay | Admitting: Nurse Practitioner

## 2022-08-29 ENCOUNTER — Ambulatory Visit (INDEPENDENT_AMBULATORY_CARE_PROVIDER_SITE_OTHER): Payer: 59 | Admitting: Nurse Practitioner

## 2022-08-29 VITALS — BP 120/83 | HR 60 | Ht 64.0 in | Wt 216.8 lb

## 2022-08-29 DIAGNOSIS — Z6836 Body mass index (BMI) 36.0-36.9, adult: Secondary | ICD-10-CM | POA: Diagnosis not present

## 2022-08-29 DIAGNOSIS — I10 Essential (primary) hypertension: Secondary | ICD-10-CM

## 2022-08-29 DIAGNOSIS — Z23 Encounter for immunization: Secondary | ICD-10-CM

## 2022-08-29 DIAGNOSIS — E119 Type 2 diabetes mellitus without complications: Secondary | ICD-10-CM

## 2022-08-29 DIAGNOSIS — Z1211 Encounter for screening for malignant neoplasm of colon: Secondary | ICD-10-CM

## 2022-08-29 LAB — POCT GLYCOSYLATED HEMOGLOBIN (HGB A1C): HbA1c POC (<> result, manual entry): 4.9 % (ref 4.0–5.6)

## 2022-08-29 LAB — POCT UA - MICROALBUMIN
Creatinine, POC: 10 mg/dL
Microalbumin Ur, POC: 10 mg/L

## 2022-08-29 MED ORDER — RYBELSUS 14 MG PO TABS: 14.0000 mg | ORAL_TABLET | Freq: Every day | ORAL | 1 refills | Status: AC

## 2022-08-29 MED ORDER — HYDROCHLOROTHIAZIDE 12.5 MG PO TABS: 12.5000 mg | ORAL_TABLET | Freq: Every day | ORAL | 1 refills | Status: DC

## 2022-08-29 NOTE — Progress Notes (Signed)
Established patient visit   Patient: Rachel Gordon   DOB: 07/02/1966   57 y.o. Female  MRN: [id number] Visit Date: 08/29/2022   Chief Complaint  Patient presents with   Follow-up   Diabetes   Subjective    Diabetes Pertinent negatives for hypoglycemia include no dizziness, headaches or nervousness/anxiousness. Pertinent negatives for diabetes include no chest pain, no fatigue, no polydipsia, no polyuria and no weakness.    Follow up -type 2 diabetes  -HgbA1c 4.7 today  -some proteinuria present today  -iron deficiency anemia --improving with oral iron supplement  --being followed per hematology now.  -due for screening colonoscopy.    Medications: Outpatient Medications Prior to Visit  Medication Sig   buPROPion (WELLBUTRIN SR) 150 MG 12 hr tablet Take 1 tablet (150 mg total) by mouth daily.   CALCIUM PO Take 1 tablet by mouth 3 (three) times daily.   Multiple Vitamins-Minerals (MULTIVITAMIN WITH MINERALS) tablet Take 1 tablet by mouth daily. Bariatric   [DISCONTINUED] hydrochlorothiazide (HYDRODIURIL) 12.5 MG tablet Take 1 tablet (12.5 mg total) by mouth daily.   [DISCONTINUED] Semaglutide (RYBELSUS) 14 MG TABS Take 1 tablet (14 mg total) by mouth daily.   No facility-administered medications prior to visit.    Review of Systems  Constitutional:  Negative for activity change, appetite change, chills, fatigue and fever.  HENT:  Negative for congestion, postnasal drip, rhinorrhea, sinus pressure, sinus pain, sneezing and sore throat.   Eyes: Negative.   Respiratory:  Negative for cough, chest tightness, shortness of breath and wheezing.   Cardiovascular:  Negative for chest pain and palpitations.  Gastrointestinal:  Negative for abdominal pain, constipation, diarrhea, nausea and vomiting.  Endocrine: Negative for cold intolerance, heat intolerance, polydipsia and polyuria.       Blood sugars doing well    Genitourinary:  Negative for dyspareunia, dysuria, flank  pain, frequency and urgency.  Musculoskeletal:  Negative for arthralgias, back pain and myalgias.  Skin:  Negative for rash.  Allergic/Immunologic: Negative for environmental allergies.  Neurological:  Negative for dizziness, weakness and headaches.  Hematological:  Negative for adenopathy.       Recent history of iron deficiency anemia   Psychiatric/Behavioral:  Positive for dysphoric mood. The patient is not nervous/anxious.        Doing well on current dose wellbutrin     Last CBC Lab Results  Component Value Date   WBC 5.5 08/01/2022   HGB 11.4 (L) 08/01/2022   HCT 39.6 08/01/2022   MCV 72.9 (L) 08/01/2022   MCH 21.0 (L) 08/01/2022   RDW Not Measured 08/01/2022   PLT 315 [id number]   Last metabolic panel Lab Results  Component Value Date   GLUCOSE 103 (H) 06/17/2022   NA 137 06/17/2022   K 3.6 06/17/2022   CL 98 06/17/2022   CO2 23 06/17/2022   BUN 19 06/17/2022   CREATININE 0.93 06/17/2022   EGFR 73 06/17/2022   CALCIUM 9.5 06/17/2022   PROT 7.0 06/17/2022   ALBUMIN 4.4 06/17/2022   LABGLOB 2.6 06/17/2022   AGRATIO 1.7 06/17/2022   BILITOT 0.4 06/17/2022   ALKPHOS 67 06/17/2022   AST 17 06/17/2022   ALT 17 06/17/2022   ANIONGAP 13 10/29/2019   Last lipids Lab Results  Component Value Date   CHOL 199 06/17/2022   HDL 90 06/17/2022   LDLCALC 94 06/17/2022   TRIG 82 06/17/2022   CHOLHDL 2.2 06/17/2022   Last hemoglobin A1c Lab Results  Component Value Date   HGBA1C 5.0  04/25/2022   Last thyroid functions Lab Results  Component Value Date   TSH 1.730 06/17/2022   T3TOTAL 107 10/04/2018   Last vitamin D Lab Results  Component Value Date   VD25OH 31.8 06/17/2022       Objective     Today's Vitals   08/29/22 1354  BP: 120/83  Pulse: 60  SpO2: 100%  Weight: 216 lb 12.8 oz (98.3 kg)  Height: 5' 4"$  (1.626 m)   Body mass index is 37.21 kg/m.  BP Readings from Last 3 Encounters:  08/29/22 120/83  08/01/22 133/88  04/25/22 112/71    Wt  Readings from Last 3 Encounters:  08/29/22 216 lb 12.8 oz (98.3 kg)  08/01/22 214 lb 11.2 oz (97.4 kg)  04/25/22 215 lb 12.8 oz (97.9 kg)    Physical Exam Vitals and nursing note reviewed.  Constitutional:      Appearance: Normal appearance. She is well-developed.  HENT:     Head: Normocephalic and atraumatic.  Eyes:     Pupils: Pupils are equal, round, and reactive to light.  Cardiovascular:     Rate and Rhythm: Normal rate and regular rhythm.     Pulses: Normal pulses.     Heart sounds: Normal heart sounds.  Pulmonary:     Effort: Pulmonary effort is normal.     Breath sounds: Normal breath sounds.  Abdominal:     Palpations: Abdomen is soft.  Musculoskeletal:        General: Normal range of motion.     Cervical back: Normal range of motion and neck supple.  Lymphadenopathy:     Cervical: No cervical adenopathy.  Skin:    General: Skin is warm and dry.     Capillary Refill: Capillary refill takes less than 2 seconds.  Neurological:     General: No focal deficit present.     Mental Status: She is alert and oriented to person, place, and time.  Psychiatric:        Mood and Affect: Mood normal.        Behavior: Behavior normal.        Thought Content: Thought content normal.        Judgment: Judgment normal.      Assessment & Plan    1. Type 2 diabetes mellitus without complication, without long-term current use of insulin (HCC) HgbA1c 4.7 today with mild proteinuria. Continue rybelsus as prescribed. Recheck HgbA1c in 3 months  - POCT UA - Microalbumin - POCT glycosylated hemoglobin (Hb A1C) - Semaglutide (RYBELSUS) 14 MG TABS; Take 1 tablet (14 mg total) by mouth daily.  Dispense: 90 tablet; Refill: 1  2. Hypertension, unspecified type Stable. Continue HCTZ 12.5 mg daily.  - hydrochlorothiazide (HYDRODIURIL) 12.5 MG tablet; Take 1 tablet (12.5 mg total) by mouth daily.  Dispense: 90 tablet; Refill: 1 - Semaglutide (RYBELSUS) 14 MG TABS; Take 1 tablet (14 mg total)  by mouth daily.  Dispense: 90 tablet; Refill: 1  3. BMI 36.0-36.9,adult Stable. Continue with low calorie, high-protein diet. Incorporate exercise into daily activities.   - Semaglutide (RYBELSUS) 14 MG TABS; Take 1 tablet (14 mg total) by mouth daily.  Dispense: 90 tablet; Refill: 1  4. Screening for colon cancer .refer to GI for screening colonoscopy   - Ambulatory referral to Gastroenterology  5. Need for shingles vaccine 1st shingles vaccine administered during today's visit.  - Varicella-zoster vaccine IM  Problem List Items Addressed This Visit       Cardiovascular and Mediastinum  Hypertension   Relevant Medications   hydrochlorothiazide (HYDRODIURIL) 12.5 MG tablet   Semaglutide (RYBELSUS) 14 MG TABS     Endocrine   Type 2 diabetes mellitus without complication, without long-term current use of insulin (HCC) - Primary   Relevant Medications   Semaglutide (RYBELSUS) 14 MG TABS   Other Relevant Orders   POCT UA - Microalbumin   POCT glycosylated hemoglobin (Hb A1C)     Other   BMI 36.0-36.9,adult   Relevant Medications   Semaglutide (RYBELSUS) 14 MG TABS   Other Visit Diagnoses     Screening for colon cancer       Relevant Orders   Ambulatory referral to Gastroenterology   Need for shingles vaccine       Relevant Orders   Varicella-zoster vaccine IM        Return in about 4 months (around 12/28/2022) for diabetes with HgbA1c check. 2nd shingles vaccine .         Ronnell Freshwater, NP  [facility] [phone] (phone) [phone] (fax)  [place]

## 2022-10-03 ENCOUNTER — Other Ambulatory Visit: Payer: 59

## 2022-10-04 ENCOUNTER — Other Ambulatory Visit: Payer: Self-pay | Admitting: Nurse Practitioner

## 2022-10-04 DIAGNOSIS — Z6836 Body mass index (BMI) 36.0-36.9, adult: Secondary | ICD-10-CM

## 2022-10-04 DIAGNOSIS — F411 Generalized anxiety disorder: Secondary | ICD-10-CM

## 2022-10-07 ENCOUNTER — Other Ambulatory Visit: Payer: 59

## 2022-10-10 ENCOUNTER — Other Ambulatory Visit: Payer: 59

## 2022-10-10 ENCOUNTER — Ambulatory Visit: Payer: 59 | Admitting: Physician Assistant

## 2022-10-14 ENCOUNTER — Inpatient Hospital Stay: Payer: 59 | Attending: Hematology

## 2022-10-14 DIAGNOSIS — D509 Iron deficiency anemia, unspecified: Secondary | ICD-10-CM | POA: Insufficient documentation

## 2022-10-14 LAB — IRON AND TIBC
Iron: 84 ug/dL (ref 28–170)
Saturation Ratios: 18 % (ref 10.4–31.8)
TIBC: 459 ug/dL — ABNORMAL HIGH (ref 250–450)
UIBC: 375 ug/dL

## 2022-10-14 LAB — CBC WITH DIFFERENTIAL/PLATELET
Abs Immature Granulocytes: 0.03 10*3/uL (ref 0.00–0.07)
Basophils Absolute: 0.1 10*3/uL (ref 0.0–0.1)
Basophils Relative: 1 %
Eosinophils Absolute: 0.1 10*3/uL (ref 0.0–0.5)
Eosinophils Relative: 2 %
HCT: 46.1 % — ABNORMAL HIGH (ref 36.0–46.0)
Hemoglobin: 14.7 g/dL (ref 12.0–15.0)
Immature Granulocytes: 1 %
Lymphocytes Relative: 35 %
Lymphs Abs: 2.1 10*3/uL (ref 0.7–4.0)
MCH: 26.3 pg (ref 26.0–34.0)
MCHC: 31.9 g/dL (ref 30.0–36.0)
MCV: 82.6 fL (ref 80.0–100.0)
Monocytes Absolute: 0.4 10*3/uL (ref 0.1–1.0)
Monocytes Relative: 7 %
Neutro Abs: 3.2 10*3/uL (ref 1.7–7.7)
Neutrophils Relative %: 54 %
Platelets: 247 10*3/uL (ref 150–400)
RBC: 5.58 MIL/uL — ABNORMAL HIGH (ref 3.87–5.11)
RDW: 17.8 % — ABNORMAL HIGH (ref 11.5–15.5)
WBC: 5.9 10*3/uL (ref 4.0–10.5)
nRBC: 0 % (ref 0.0–0.2)

## 2022-10-14 LAB — FERRITIN: Ferritin: 16 ng/mL (ref 11–307)

## 2022-10-17 ENCOUNTER — Other Ambulatory Visit: Payer: Self-pay

## 2022-10-17 ENCOUNTER — Inpatient Hospital Stay: Payer: 59 | Attending: Hematology | Admitting: Physician Assistant

## 2022-10-17 VITALS — BP 116/78 | HR 65 | Temp 98.7°F | Resp 17 | Ht 64.0 in | Wt 220.2 lb

## 2022-10-17 DIAGNOSIS — Z800 Family history of malignant neoplasm of digestive organs: Secondary | ICD-10-CM | POA: Diagnosis not present

## 2022-10-17 DIAGNOSIS — E559 Vitamin D deficiency, unspecified: Secondary | ICD-10-CM | POA: Diagnosis not present

## 2022-10-17 DIAGNOSIS — Z803 Family history of malignant neoplasm of breast: Secondary | ICD-10-CM | POA: Insufficient documentation

## 2022-10-17 DIAGNOSIS — E119 Type 2 diabetes mellitus without complications: Secondary | ICD-10-CM | POA: Insufficient documentation

## 2022-10-17 DIAGNOSIS — Z9484 Stem cells transplant status: Secondary | ICD-10-CM | POA: Diagnosis not present

## 2022-10-17 DIAGNOSIS — E669 Obesity, unspecified: Secondary | ICD-10-CM | POA: Insufficient documentation

## 2022-10-17 DIAGNOSIS — D508 Other iron deficiency anemias: Secondary | ICD-10-CM

## 2022-10-17 DIAGNOSIS — E282 Polycystic ovarian syndrome: Secondary | ICD-10-CM | POA: Diagnosis not present

## 2022-10-17 DIAGNOSIS — D509 Iron deficiency anemia, unspecified: Secondary | ICD-10-CM | POA: Diagnosis present

## 2022-10-17 DIAGNOSIS — F419 Anxiety disorder, unspecified: Secondary | ICD-10-CM | POA: Diagnosis not present

## 2022-10-17 NOTE — Progress Notes (Signed)
[place], [place] [address]   CLINIC:  Medical Oncology/Hematology  PCP:  Ronnell Freshwater, NP [place] [address] [phone]   REASON FOR VISIT:  Follow-up for iron deficiency anemia  CURRENT THERAPY: Oral iron supplement  INTERVAL HISTORY:   Rachel Gordon 57 y.o. female returns for routine follow-up of iron deficiency anemia.  She was seen for initial consultation by Dr. Delton Coombes and Tarri Abernethy PA-C on 08/01/2022.  At today's visit, she reports feeling well.  No recent hospitalizations, surgeries, or changes in baseline health status.  She continues to take iron tablet daily, and is tolerating this well.   She has had regular menstrual periods for the past year (up until October 2023, with some light spotting since that time); menses described as heavy secondary to PCOS.   No obvious rectal bleeding or melena.  She reports baseline energy at about 85%.  She continues to eat ice frequently.  She denies any lightheadedness, syncope, chest pain, or dyspnea. She has 85% energy and 100% appetite. She endorses that she is maintaining a stable weight.  ASSESSMENT & PLAN:  1.  Severe iron deficiency anemia - Seen at the request of her primary care provider Leretha Pol NP) for evaluation and treatment of iron deficiency anemia. - EMR lab review:  CBC (06/17/2022) Hgb 7.8 and MCV 62.  WBC and platelets were normal. Iron panel (06/17/2022) ferritin 4, iron saturation 3% and TIBC 517. Normal folate 13.8, normal B12 844. Labs the year prior showed normal Hgb 12.5 in December 2022.  She has had microcytosis ever since bariatric surgery in April 2021. 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 - Taking daily iron tablet since December 2023 - Regular menstrual periods for the past year (up until October 2023, with light spotting since that time); menses described as heavy secondary to PCOS. - No obvious  rectal bleeding or melena.  Hemoccult stool negative x 3.   -Most recent labs (10/14/2022) with normal hemoglobin 14.7/MCV 82.6.  Ferritin 16, iron saturation 18% with improved (but elevated) TIBC 459  - She has never had EGD.  She receives colonoscopies every 5 years due to strong family history of colon cancer, last performed in 2019 by Dr. Gustavo Lah [facility]).  - PLAN: Blood and iron levels continue to improve without parenteral iron. - Continue daily iron tablet. - Repeat labs with PHONE visit in 6 months.  (Patient is moving to [place], and may call to cancel this if she has establish care with local PCP prior to her follow-up visit here)   2.  Other history - PMH: Type 2 diabetes mellitus, PCOS, obesity, anxiety, and vitamin D deficiency.  - FAMILY:  Lives with a roommate.  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  Prior to gastric surgery she would drink about 2-3 alcoholic beverages each week, but no longer drinks alcohol.  No history of tobacco or illicit drug use.  - SOCIAL: Colon Cancer in both parents (mother diagnosed at age 20, father diagnosed at age 21). Maternal grandmother had breast cancer.   PLAN SUMMARY: >> Labs in 6 months = CBC/D, ferritin, iron/TIBC >> PHONE visit in 6 months (after labs)     REVIEW OF SYSTEMS:   Review of Systems  Constitutional:  Positive for fatigue. Negative for appetite change, chills, diaphoresis, fever and unexpected weight change.  HENT:  Negative for lump/mass and nosebleeds.   Eyes:  Negative for eye problems.  Respiratory:  Negative for cough, hemoptysis and shortness of breath.   Cardiovascular:  Negative for chest pain, leg swelling and palpitations.  Gastrointestinal:  Positive for constipation. Negative for abdominal pain, blood in stool, diarrhea, nausea and vomiting.  Genitourinary:  Negative for hematuria.   Skin: Negative.   Neurological:  Negative for  dizziness, headaches and light-headedness.  Hematological:  Does not bruise/bleed easily.     PHYSICAL EXAM:  ECOG PERFORMANCE STATUS: 0 - Asymptomatic  There were no vitals filed for this visit. There were no vitals filed for this visit. Physical Exam Constitutional:      Appearance: Normal appearance. She is obese.  Cardiovascular:     Heart sounds: Normal heart sounds.  Pulmonary:     Breath sounds: Normal breath sounds.  Neurological:     General: No focal deficit present.     Mental Status: Mental status is at baseline.  Psychiatric:        Behavior: Behavior normal. Behavior is cooperative.     PAST MEDICAL/SURGICAL HISTORY:  Past Medical History:  Diagnosis Date   Abnormal uterine bleeding (AUB)    Anemia    Chronic kidney disease    DM (diabetes mellitus) ([place])    h/o had bariatric surgery in 2021 and is a1c is now 5.4 as of 01-15-21   Fibroid    GERD (gastroesophageal reflux disease)    Headache    h/o   History of hiatal hernia    Hyperlipidemia    Hyperlipidemia    Hypertension    h/o had bariatric sx in 2021 and has been off meds since   Metrorrhagia    Obesity    bmi 54   PCOS (polycystic ovarian syndrome)    S/P endometrial ablation    Sleep apnea    h/o has been off cpap since bariatric sx in 2021   Vaginal Pap smear, abnormal 2009   pos hvp   Past Surgical History:  Procedure Laterality Date   COLONOSCOPY     DILATATION & CURETTAGE/HYSTEROSCOPY WITH MYOSURE N/A 11/28/2014   Procedure: DILATATION & CURETTAGE/HYSTEROSCOPY WITH MYOSURE;  Surgeon: Will Bonnet, MD;  Location: [facility] ORS;  Service: Gynecology;  Laterality: N/A;   DILATATION & CURETTAGE/HYSTEROSCOPY WITH MYOSURE N/A 01/26/2021   Procedure: DILATATION & CURETTAGE/HYSTEROSCOPY WITH MYOSURE;  Surgeon: Will Bonnet, MD;  Location: [facility] ORS;  Service: Gynecology;  Laterality: N/A;   DILATION AND CURETTAGE OF UTERUS  2002   LAPAROSCOPIC GASTRIC SLEEVE RESECTION N/A 10/29/2019    Procedure: LAPAROSCOPIC GASTRIC SLEEVE RESECTION, Upper Endo, ERAS Pathway;  Surgeon: Johnathan Hausen, MD;  Location: [facility] ORS;  Service: General;  Laterality: N/A;   NOVASURE ABLATION     TONSILLECTOMY     age 86    SOCIAL HISTORY:  Social History   Socioeconomic History   Marital status: Single    Spouse name: Not on file   Number of children: Not on file   Years of education: Not on file   Highest education level: Not on file  Occupational History   Not on file  Tobacco Use   Smoking status: Never   Smokeless tobacco: Never  Vaping Use   Vaping Use: Never used  Substance and Sexual Activity   Alcohol use: Not Currently    Comment: socially   Drug use: No   Sexual activity: Yes    Partners: Male    Birth control/protection: None  Other  Topics Concern   Not on file  Social History Narrative   Not on file   Social Determinants of Health   Financial Resource Strain: Not on file  Food Insecurity: No Food Insecurity (08/01/2022)   Hunger Vital Sign    Worried About Running Out of Food in the Last Year: Never true    Ran Out of Food in the Last Year: Never true  Transportation Needs: No Transportation Needs (08/01/2022)   PRAPARE - Hydrologist (Medical): No    Lack of Transportation (Non-Medical): No  Physical Activity: Not on file  Stress: Not on file  Social Connections: Unknown (08/01/2022)   Social Connection and Isolation Panel [NHANES]    Frequency of Communication with Friends and Family: Never    Frequency of Social Gatherings with Friends and Family: Never    Attends Religious Services: Never    Marine scientist or Organizations: Not on file    Attends Archivist Meetings: Not on file    Marital Status: Not on file  Intimate Partner Violence: Not At Risk (08/01/2022)   Humiliation, Afraid, Rape, and Kick questionnaire    Fear of Current or Ex-Partner: No    Emotionally Abused: No    Physically Abused: No     Sexually Abused: No    FAMILY HISTORY:  Family History  Problem Relation Age of Onset   Colon cancer Mother 31   Colon cancer Father 58   Breast cancer Maternal Grandmother 65    CURRENT MEDICATIONS:  Outpatient Encounter Medications as of 10/17/2022  Medication Sig   buPROPion (WELLBUTRIN SR) 150 MG 12 hr tablet TAKE 1 TABLET DAILY   CALCIUM PO Take 1 tablet by mouth 3 (three) times daily.   hydrochlorothiazide (HYDRODIURIL) 12.5 MG tablet Take 1 tablet (12.5 mg total) by mouth daily.   Multiple Vitamins-Minerals (MULTIVITAMIN WITH MINERALS) tablet Take 1 tablet by mouth daily. Bariatric   Semaglutide (RYBELSUS) 14 MG TABS Take 1 tablet (14 mg total) by mouth daily.   No facility-administered encounter medications on file as of 10/17/2022.    ALLERGIES:  No Known Allergies  LABORATORY DATA:  I have reviewed the labs as listed.  CBC    Component Value Date/Time   WBC 5.9 10/14/2022 0905   RBC 5.58 (H) 10/14/2022 0905   HGB 14.7 10/14/2022 0905   HGB 7.8 (L) 06/17/2022 1006   HCT 46.1 (H) 10/14/2022 0905   HCT 28.9 (L) 06/17/2022 1006   PLT 247 10/14/2022 0905   PLT 369 06/17/2022 1006   MCV 82.6 10/14/2022 0905   MCV 62 (L) 06/17/2022 1006   MCH 26.3 10/14/2022 0905   MCHC 31.9 10/14/2022 0905   RDW 17.8 (H) 10/14/2022 0905   RDW 17.4 (H) 06/17/2022 1006   LYMPHSABS 2.1 10/14/2022 0905   LYMPHSABS 2.0 10/09/2020 0920   MONOABS 0.4 10/14/2022 0905   EOSABS 0.1 10/14/2022 0905   EOSABS 0.1 10/09/2020 0920   BASOSABS 0.1 10/14/2022 0905   BASOSABS 0.1 10/09/2020 0920      Latest Ref Rng & Units 06/17/2022   10:06 AM 03/25/2021    9:27 AM 01/25/2021    3:25 PM  CMP  Glucose 70 - 99 mg/dL 103  86    BUN 6 - 24 mg/dL 19  26    Creatinine 0.57 - 1.00 mg/dL 0.93  0.70    Sodium 134 - 144 mmol/L 137  138    Potassium 3.5 - 5.2 mmol/L 3.6  4.9  3.3   Chloride 96 - 106 mmol/L 98  100    CO2 20 - 29 mmol/L 23  22    Calcium 8.7 - 10.2 mg/dL 9.5  9.5    Total Protein 6.0  - 8.5 g/dL 7.0     Total Bilirubin 0.0 - 1.2 mg/dL 0.4     Alkaline Phos 44 - 121 IU/L 67     AST 0 - 40 IU/L 17     ALT 0 - 32 IU/L 17       DIAGNOSTIC IMAGING:  I have independently reviewed the relevant imaging and discussed with the patient.   WRAP UP:  All questions were answered. The patient knows to call the clinic with any problems, questions or concerns.  Medical decision making: Low  Time spent on visit: I spent 15 minutes counseling the patient face to face. The total time spent in the appointment was 22 minutes and more than 50% was on counseling.  Harriett Rush, PA-C  10/17/2022 1:28 PM

## 2022-10-17 NOTE — Patient Instructions (Addendum)
[facility] [place] **   You were seen today by Tarri Abernethy PA-C for your iron deficiency anemia.   Your blood count (hemoglobin) is back to normal! Your iron levels are still low, but are improved. Continue to take iron tablet daily. You should have your blood and iron levels rechecked in 6 months.  We will go ahead and schedule you for that via phone visit, but you can cancel it if needed. However, if you have found a primary care doctor in [place] before that visit, they should be able to manage your blood count and iron levels going forward and can send you to local hematologist in [place] if you have any new or worsening changes.    ** Thank you for trusting me with your healthcare!  I strive to provide all of my patients with quality care at each visit.  If you receive a survey for this visit, I would be so grateful to you for taking the time to provide feedback.  Thank you in advance!  ~ Mizuki Hoel                   Dr. Derek Jack   &   Tarri Abernethy, PA-C   - - - - - - - - - - - - - - - - - -    Thank you for choosing [facility] at [facility] to provide your oncology and hematology care.  To afford each patient quality time with our provider, please arrive at least 15 minutes before your scheduled appointment time.   If you have a lab appointment with the [facility] please come in thru the Main Entrance and check in at the main information desk.  You need to re-schedule your appointment should you arrive 10 or more minutes late.  We strive to give you quality time with our providers, and arriving late affects you and other patients whose appointments are after yours.  Also, if you no show three or more times for appointments you may be dismissed from the clinic at the providers discretion.     Again, thank you for choosing [facility].  Our hope is that these  requests will decrease the amount of time that you wait before being seen by our physicians.       _____________________________________________________________  Should you have questions after your visit to [facility], please contact our office at [phone] and follow the prompts.  Our office hours are 8:00 a.m. and 4:30 p.m. Monday - Friday.  Please note that voicemails left after 4:00 p.m. may not be returned until the following business day.  We are closed weekends and major holidays.  You do have access to a nurse 24-7, just call the main number to the clinic [phone] and do not press any options, hold on the line and a nurse will answer the phone.    For prescription refill requests, have your pharmacy contact our office and allow 72 hours.

## 2023-01-02 ENCOUNTER — Ambulatory Visit: Payer: 59 | Admitting: Nurse Practitioner

## 2023-01-12 ENCOUNTER — Other Ambulatory Visit: Payer: Self-pay | Admitting: Nurse Practitioner

## 2023-01-12 DIAGNOSIS — Z6836 Body mass index (BMI) 36.0-36.9, adult: Secondary | ICD-10-CM

## 2023-01-12 DIAGNOSIS — F411 Generalized anxiety disorder: Secondary | ICD-10-CM

## 2023-01-13 MED ORDER — BUPROPION HCL ER (SR) 150 MG PO TB12
150.0000 mg | ORAL_TABLET | Freq: Every day | ORAL | 3 refills | Status: AC
Start: 2023-01-13 — End: ?

## 2023-04-17 ENCOUNTER — Inpatient Hospital Stay: Payer: 59

## 2023-04-21 ENCOUNTER — Inpatient Hospital Stay: Payer: 59 | Admitting: Oncology

## 2023-05-30 ENCOUNTER — Other Ambulatory Visit: Payer: Self-pay | Admitting: Nurse Practitioner

## 2023-05-30 DIAGNOSIS — I10 Essential (primary) hypertension: Secondary | ICD-10-CM

## 2023-06-14 ENCOUNTER — Other Ambulatory Visit: Payer: Self-pay | Admitting: Family Medicine

## 2023-06-14 DIAGNOSIS — I10 Essential (primary) hypertension: Secondary | ICD-10-CM

## 2023-06-14 MED ORDER — HYDROCHLOROTHIAZIDE 12.5 MG PO TABS
12.5000 mg | ORAL_TABLET | Freq: Every day | ORAL | 1 refills | Status: AC
Start: 2023-06-14 — End: ?

## 2023-09-22 ENCOUNTER — Other Ambulatory Visit: Payer: Self-pay | Admitting: Nurse Practitioner

## 2023-09-22 DIAGNOSIS — I10 Essential (primary) hypertension: Secondary | ICD-10-CM

## 2023-09-22 DIAGNOSIS — Z6836 Body mass index (BMI) 36.0-36.9, adult: Secondary | ICD-10-CM

## 2023-09-22 DIAGNOSIS — E119 Type 2 diabetes mellitus without complications: Secondary | ICD-10-CM

## 2023-11-11 ENCOUNTER — Other Ambulatory Visit: Payer: Self-pay | Admitting: Family Medicine

## 2023-11-11 DIAGNOSIS — I10 Essential (primary) hypertension: Secondary | ICD-10-CM

## 2024-02-29 ENCOUNTER — Other Ambulatory Visit: Payer: Self-pay

## 2024-06-04 ENCOUNTER — Encounter (HOSPITAL_COMMUNITY): Payer: Self-pay | Admitting: *Deleted
